# Patient Record
Sex: Female | Born: 1995 | Race: Black or African American | Hispanic: No | Marital: Single | Smoking: Never smoker
Health system: Southern US, Community
[De-identification: ages and names within clinical notes are randomized; demographics above are authoritative.]

## PROBLEM LIST (undated history)

## (undated) DIAGNOSIS — Z789 Other specified health status: Secondary | ICD-10-CM

## (undated) HISTORY — PX: NO PAST SURGERIES: SHX2092

---

## 2018-08-08 ENCOUNTER — Ambulatory Visit (HOSPITAL_COMMUNITY)
Admission: EM | Admit: 2018-08-08 | Discharge: 2018-08-08 | Discharge status: Absent without leave | Payer: PRIVATE HEALTH INSURANCE

## 2018-11-07 DIAGNOSIS — J45909 Unspecified asthma, uncomplicated: Secondary | ICD-10-CM | POA: Insufficient documentation

## 2019-09-26 ENCOUNTER — Other Ambulatory Visit: Payer: Self-pay

## 2019-09-26 DIAGNOSIS — Z20822 Contact with and (suspected) exposure to covid-19: Secondary | ICD-10-CM

## 2019-09-27 LAB — NOVEL CORONAVIRUS, NAA: SARS-CoV-2, NAA: NOT DETECTED

## 2020-10-29 DIAGNOSIS — U071 COVID-19: Secondary | ICD-10-CM | POA: Diagnosis not present

## 2020-11-01 DIAGNOSIS — Z20822 Contact with and (suspected) exposure to covid-19: Secondary | ICD-10-CM | POA: Diagnosis not present

## 2021-06-24 ENCOUNTER — Inpatient Hospital Stay (HOSPITAL_COMMUNITY)
Admission: AD | Admit: 2021-06-24 | Discharge: 2021-06-24 | Disposition: A | Discharge status: Home / Self Care | Payer: BC Managed Care – PPO | Attending: Obstetrics and Gynecology | Admitting: Obstetrics and Gynecology

## 2021-06-24 ENCOUNTER — Other Ambulatory Visit: Payer: Self-pay

## 2021-06-24 ENCOUNTER — Inpatient Hospital Stay (HOSPITAL_COMMUNITY): Payer: BC Managed Care – PPO

## 2021-06-24 ENCOUNTER — Encounter (HOSPITAL_COMMUNITY): Payer: Self-pay | Admitting: Obstetrics and Gynecology

## 2021-06-24 DIAGNOSIS — O021 Missed abortion: Secondary | ICD-10-CM | POA: Diagnosis not present

## 2021-06-24 DIAGNOSIS — O26851 Spotting complicating pregnancy, first trimester: Secondary | ICD-10-CM | POA: Diagnosis not present

## 2021-06-24 DIAGNOSIS — Z3A08 8 weeks gestation of pregnancy: Secondary | ICD-10-CM | POA: Insufficient documentation

## 2021-06-24 DIAGNOSIS — O99891 Other specified diseases and conditions complicating pregnancy: Secondary | ICD-10-CM | POA: Diagnosis not present

## 2021-06-24 DIAGNOSIS — N898 Other specified noninflammatory disorders of vagina: Secondary | ICD-10-CM | POA: Diagnosis not present

## 2021-06-24 DIAGNOSIS — Z5321 Procedure and treatment not carried out due to patient leaving prior to being seen by health care provider: Secondary | ICD-10-CM | POA: Diagnosis not present

## 2021-06-24 DIAGNOSIS — O209 Hemorrhage in early pregnancy, unspecified: Secondary | ICD-10-CM | POA: Diagnosis not present

## 2021-06-24 DIAGNOSIS — O26899 Other specified pregnancy related conditions, unspecified trimester: Secondary | ICD-10-CM

## 2021-06-24 HISTORY — DX: Other specified health status: Z78.9

## 2021-06-24 LAB — CBC WITH DIFFERENTIAL/PLATELET
Abs Immature Granulocytes: 0.03 10*3/uL (ref 0.00–0.07)
Basophils Absolute: 0 10*3/uL (ref 0.0–0.1)
Basophils Relative: 1 %
Eosinophils Absolute: 0.1 10*3/uL (ref 0.0–0.5)
Eosinophils Relative: 1 %
HCT: 34 % — ABNORMAL LOW (ref 36.0–46.0)
Hemoglobin: 11.8 g/dL — ABNORMAL LOW (ref 12.0–15.0)
Immature Granulocytes: 1 %
Lymphocytes Relative: 24 %
Lymphs Abs: 1.6 10*3/uL (ref 0.7–4.0)
MCH: 31 pg (ref 26.0–34.0)
MCHC: 34.7 g/dL (ref 30.0–36.0)
MCV: 89.2 fL (ref 80.0–100.0)
Monocytes Absolute: 0.7 10*3/uL (ref 0.1–1.0)
Monocytes Relative: 10 %
Neutro Abs: 4.2 10*3/uL (ref 1.7–7.7)
Neutrophils Relative %: 63 %
Platelets: 331 10*3/uL (ref 150–400)
RBC: 3.81 MIL/uL — ABNORMAL LOW (ref 3.87–5.11)
RDW: 11.8 % (ref 11.5–15.5)
WBC: 6.6 10*3/uL (ref 4.0–10.5)
nRBC: 0 % (ref 0.0–0.2)

## 2021-06-24 LAB — URINALYSIS, ROUTINE W REFLEX MICROSCOPIC
Bilirubin Urine: NEGATIVE
Glucose, UA: NEGATIVE mg/dL
Ketones, ur: NEGATIVE mg/dL
Leukocytes,Ua: NEGATIVE
Nitrite: NEGATIVE
Protein, ur: NEGATIVE mg/dL
Specific Gravity, Urine: 1.024 (ref 1.005–1.030)
pH: 8 (ref 5.0–8.0)

## 2021-06-24 LAB — WET PREP, GENITAL
Clue Cells Wet Prep HPF POC: NONE SEEN
Sperm: NONE SEEN
Trich, Wet Prep: NONE SEEN
Yeast Wet Prep HPF POC: NONE SEEN

## 2021-06-24 LAB — ABO/RH: ABO/RH(D): O POS

## 2021-06-24 LAB — POCT PREGNANCY, URINE: Preg Test, Ur: POSITIVE — AB

## 2021-06-24 LAB — HCG, QUANTITATIVE, PREGNANCY: hCG, Beta Chain, Quant, S: 188893 m[IU]/mL — ABNORMAL HIGH (ref <5)

## 2021-06-24 LAB — HIV ANTIBODY (ROUTINE TESTING W REFLEX): HIV Screen 4th Generation wRfx: NONREACTIVE

## 2021-06-24 MED ORDER — OXYCODONE-ACETAMINOPHEN 5-325 MG PO TABS: 1.0000 | ORAL_TABLET | ORAL | 0 refills | Status: DC | PRN

## 2021-06-24 MED ORDER — PROMETHAZINE HCL 12.5 MG PO TABS: 12.5000 mg | ORAL_TABLET | Freq: Four times a day (QID) | ORAL | 0 refills | Status: DC | PRN

## 2021-06-24 MED ORDER — IBUPROFEN 600 MG PO TABS: 600.0000 mg | ORAL_TABLET | Freq: Four times a day (QID) | ORAL | 0 refills | Status: DC | PRN

## 2021-06-24 NOTE — MAU Note (Signed)
Pt reports she had a positive test in July.  [facility] . Started having some old brownish blood when she wiped for the past few days. Denies any cramping or pain.

## 2021-06-24 NOTE — MAU Provider Note (Signed)
History     CSN: [id number]  Arrival date and time: 06/24/21 1220   Event Date/Time   First Provider Initiated Contact with Patient 06/24/21 1255      No chief complaint on file.  HPI Ms. Genola Mourer is a 25 y.o. G2P0010 at [redacted]w[redacted]d who presents to MAU today with complaint of vaginal bleeding. The patient states brown spotting started recently. She denies abdominal pain, cramping, UTI symptoms, other abnormal discharge, bright red bleeding, N/V/D or constipation. She [facility] [place] [facility] showing "a sac". She denies recent intercourse or exam.   OB History     Gravida  2   Para      Term      Preterm      AB  1   Living         SAB  1   IAB      Ectopic      Multiple      Live Births              Past Medical History:  Diagnosis Date   Medical history non-contributory     Past Surgical History:  Procedure Laterality Date   NO PAST SURGERIES      No family history on file.  Social History   Tobacco Use   Smoking status: Never   Smokeless tobacco: Never  Vaping Use   Vaping Use: Former   Start date: 05/25/2021  Substance Use Topics   Alcohol use: Not Currently   Drug use: Not Currently    Allergies:  Allergies  Allergen Reactions   Shellfish Allergy Nausea And Vomiting    No medications prior to admission.    Review of Systems  Constitutional:  Negative for fever.  Gastrointestinal:  Negative for abdominal pain, constipation, diarrhea, nausea and vomiting.  Genitourinary:  Positive for vaginal bleeding and vaginal discharge. Negative for dysuria, frequency and urgency.  Physical Exam   Blood pressure 129/70, pulse 94, temperature 98.6 F (37 C), resp. rate 18, height 5\' 5"  (1.651 m), weight 68.5 kg, last menstrual period 04/23/2021.  Physical Exam Vitals and nursing note reviewed. Exam conducted with a chaperone present.  Constitutional:      General: She is not in acute distress.    Appearance: She is  well-developed.  HENT:     Head: Normocephalic and atraumatic.  Cardiovascular:     Rate and Rhythm: Normal rate.  Pulmonary:     Effort: Pulmonary effort is normal.  Abdominal:     General: There is no distension.     Palpations: Abdomen is soft. There is no mass.     Tenderness: There is no abdominal tenderness. There is no guarding or rebound.  Genitourinary:    General: Normal vulva.     Vagina: Vaginal discharge (scant, brown-blood-tinged discharge) and bleeding (scant brown) present.     Cervix: No cervical motion tenderness, discharge or friability.     Uterus: Enlarged. Not tender.      Adnexa:        Right: No mass or tenderness.         Left: Tenderness (mild) present. No mass.    Skin:    General: Skin is warm and dry.     Findings: No erythema.  Neurological:     Mental Status: She is alert and oriented to person, place, and time.  Dilation: Closed Effacement (%): Thick Cervical Position: Posterior Exam by:: [email or web address], PA-C  Results for orders placed or  performed during the hospital encounter of 06/24/21 (from the past 24 hour(s))  Pregnancy, urine POC     Status: Abnormal   Collection Time: 06/24/21 12:36 PM  Result Value Ref Range   Preg Test, Ur POSITIVE (A) NEGATIVE  Urinalysis, Routine w reflex microscopic Urine, Clean Catch     Status: Abnormal   Collection Time: 06/24/21 12:40 PM  Result Value Ref Range   Color, Urine YELLOW YELLOW   APPearance HAZY (A) CLEAR   Specific Gravity, Urine 1.024 1.005 - 1.030   pH 8.0 5.0 - 8.0   Glucose, UA NEGATIVE NEGATIVE mg/dL   Hgb urine dipstick MODERATE (A) NEGATIVE   Bilirubin Urine NEGATIVE NEGATIVE   Ketones, ur NEGATIVE NEGATIVE mg/dL   Protein, ur NEGATIVE NEGATIVE mg/dL   Nitrite NEGATIVE NEGATIVE   Leukocytes,Ua NEGATIVE NEGATIVE   RBC / HPF 11-20 0 - 5 RBC/hpf   WBC, UA 0-5 0 - 5 WBC/hpf   Bacteria, UA RARE (A) NONE SEEN   Squamous Epithelial / LPF 6-10 0 - 5   Mucus PRESENT   CBC with  Differential/Platelet     Status: Abnormal   Collection Time: 06/24/21 12:54 PM  Result Value Ref Range   WBC 6.6 4.0 - 10.5 K/uL   RBC 3.81 (L) 3.87 - 5.11 MIL/uL   Hemoglobin 11.8 (L) 12.0 - 15.0 g/dL   HCT 47.4 (L) 25.9 - 56.3 %   MCV 89.2 80.0 - 100.0 fL   MCH 31.0 26.0 - 34.0 pg   MCHC 34.7 30.0 - 36.0 g/dL   RDW 87.5 64.3 - 32.9 %   Platelets 331 150 - 400 K/uL   nRBC 0.0 0.0 - 0.2 %   Neutrophils Relative % 63 %   Neutro Abs 4.2 1.7 - 7.7 K/uL   Lymphocytes Relative 24 %   Lymphs Abs 1.6 0.7 - 4.0 K/uL   Monocytes Relative 10 %   Monocytes Absolute 0.7 0.1 - 1.0 K/uL   Eosinophils Relative 1 %   Eosinophils Absolute 0.1 0.0 - 0.5 K/uL   Basophils Relative 1 %   Basophils Absolute 0.0 0.0 - 0.1 K/uL   Immature Granulocytes 1 %   Abs Immature Granulocytes 0.03 0.00 - 0.07 K/uL  ABO/Rh     Status: None   Collection Time: 06/24/21 12:54 PM  Result Value Ref Range   ABO/RH(D) O POS    No rh immune globuloin      NOT A RH IMMUNE GLOBULIN CANDIDATE, PT RH POSITIVE Performed at [facility], [address], [place], [place] [address]   Wet prep, genital     Status: Abnormal   Collection Time: 06/24/21 12:58 PM   Specimen: PATH Cytology Cervicovaginal Ancillary Only  Result Value Ref Range   Yeast Wet Prep HPF POC NONE SEEN NONE SEEN   Trich, Wet Prep NONE SEEN NONE SEEN   Clue Cells Wet Prep HPF POC NONE SEEN NONE SEEN   WBC, Wet Prep HPF POC FEW (A) NONE SEEN   Sperm NONE SEEN    US OB Comp Less 14 Wks  Result Date: 06/24/2021 CLINICAL DATA:  Manson Passey spotting EXAM: OBSTETRIC <14 WK [place] AND TRANSVAGINAL OB US TECHNIQUE: Both transabdominal and transvaginal ultrasound examinations were performed for complete evaluation of the gestation as well as the maternal uterus, adnexal regions, and pelvic cul-de-sac. Transvaginal technique was performed to assess early pregnancy. COMPARISON:  None. FINDINGS: Intrauterine gestational sac: Single Yolk sac:  Visualized. Embryo:   Visualized. Cardiac Activity: Not  Visualized. Heart Rate: No detectable cardiac activity. CRL:  21.0 mm   8 w   4 d                  [place] EDC: 01/30/2022 Subchorionic hemorrhage:  None visualized. Maternal uterus/adnexae: Normal bilateral ovaries. IMPRESSION: Single intrauterine gestational sac. No detectable cardiac activity with embryo crown-rump length of 21.0 mm. These findings meet definitive criteria for failed pregnancy. This follows SRU consensus guidelines: Diagnostic Criteria for Nonviable Pregnancy Early in the First Trimester. [facility] [phone]. Electronically Signed   By: Caprice Renshaw   On: 06/24/2021 13:55     MAU Course  Procedures None  MDM +UPT UA, wet prep, GC/chlamydia, CBC, ABO/Rh, quant hCG, HIV, RPR and [place] today to rule out ectopic pregnancy Discussed options for management of MAB with patient including expectant management, Cytotec and surgical. Patient opts for expectant management at this time.  Assessment and Plan  A: Missed AB at [redacted]w[redacted]d  P: Discharge home Rx for Phenergan, Ibuprofen and Percocet sent to patient's pharmacy  Bleeding and infection precautions discussed Patient advised to follow-up with [facility] in 2 weeks, they will call with appointment time Patient may return to MAU as needed or if her condition were to change or worsen  Vonzella Nipple, PA-C 06/24/2021, 2:15 PM

## 2021-06-25 LAB — GC/CHLAMYDIA PROBE AMP (~~LOC~~) NOT AT ARMC
Chlamydia: NEGATIVE
Comment: NEGATIVE
Comment: NORMAL
Neisseria Gonorrhea: NEGATIVE

## 2021-06-25 LAB — RPR: RPR Ser Ql: NONREACTIVE

## 2021-06-30 DIAGNOSIS — O200 Threatened abortion: Secondary | ICD-10-CM | POA: Diagnosis not present

## 2021-07-06 DIAGNOSIS — O3680X0 Pregnancy with inconclusive fetal viability, not applicable or unspecified: Secondary | ICD-10-CM | POA: Diagnosis not present

## 2021-07-06 DIAGNOSIS — Z3A01 Less than 8 weeks gestation of pregnancy: Secondary | ICD-10-CM | POA: Diagnosis not present

## 2021-07-14 DIAGNOSIS — Z3481 Encounter for supervision of other normal pregnancy, first trimester: Secondary | ICD-10-CM | POA: Diagnosis not present

## 2021-07-14 LAB — OB RESULTS CONSOLE HGB/HCT, BLOOD
HCT: 34 (ref 29–41)
Hemoglobin: 11.5

## 2021-07-14 LAB — OB RESULTS CONSOLE PLATELET COUNT: Platelets: 345

## 2021-07-14 LAB — OB RESULTS CONSOLE RPR: RPR: NONREACTIVE

## 2021-07-14 LAB — OB RESULTS CONSOLE HEPATITIS B SURFACE ANTIGEN: Hepatitis B Surface Ag: NEGATIVE

## 2021-07-14 LAB — OB RESULTS CONSOLE ANTIBODY SCREEN: Antibody Screen: NEGATIVE

## 2021-07-14 LAB — SICKLE CELL SCREEN: Sickle Cell Screen: NORMAL

## 2021-07-14 LAB — OB RESULTS CONSOLE HIV ANTIBODY (ROUTINE TESTING): HIV: NONREACTIVE

## 2021-07-14 LAB — OB RESULTS CONSOLE ABO/RH: RH Type: POSITIVE

## 2021-07-14 LAB — OB RESULTS CONSOLE GC/CHLAMYDIA
Chlamydia: NEGATIVE
Gonorrhea: NEGATIVE

## 2021-07-14 LAB — OB RESULTS CONSOLE RUBELLA ANTIBODY, IGM: Rubella: IMMUNE

## 2021-08-07 NOTE — MAU Provider Note (Signed)
Addendum: Note entered at a later date  Patient initiated contact regarding the contents of my previous MAU note stating that the transvaginal US was not performed. The mention of transvaginal US was in the results that automatically populates into the documentation and cannot be removed, however for accuracy, per patient, she had an OB less than 14 weeks abdominal US only as the transvaginal component was unlikely necessary at this gestation to confirm the diagnosis. Abdominal US is generally performed first and the necessity of the TV component is at the discretion of the sonographer.   Vonzella Nipple, PA-C 08/07/2021 11:48 AM

## 2021-08-19 ENCOUNTER — Telehealth (INDEPENDENT_AMBULATORY_CARE_PROVIDER_SITE_OTHER): Payer: BC Managed Care – PPO

## 2021-08-19 DIAGNOSIS — Z3A16 16 weeks gestation of pregnancy: Secondary | ICD-10-CM

## 2021-08-19 DIAGNOSIS — O99512 Diseases of the respiratory system complicating pregnancy, second trimester: Secondary | ICD-10-CM

## 2021-08-19 DIAGNOSIS — J45909 Unspecified asthma, uncomplicated: Secondary | ICD-10-CM

## 2021-08-19 DIAGNOSIS — Z3492 Encounter for supervision of normal pregnancy, unspecified, second trimester: Secondary | ICD-10-CM | POA: Insufficient documentation

## 2021-08-19 NOTE — Progress Notes (Signed)
New OB Intake  I connected with  Misty Lee on 08/19/21 at  9:15 AM EDT by MyChart Video Visit and verified that I am speaking with the correct person using two identifiers. Nurse is located at [facility] and pt is located at home.  I discussed the limitations, risks, security and privacy concerns of performing an evaluation and management service by telephone and the availability of in person appointments. I also discussed with the patient that there may be a patient responsible charge related to this service. The patient expressed understanding and agreed to proceed.  I explained I am completing New OB Intake today. We discussed her EDD of 02/01/22 that is based on LMP of 04/27/21. Pt is G2/P0. I reviewed her allergies, medications, Medical/Surgical/OB history, and appropriate screenings. I informed her of [facility] services. [facility] information placed in AVS. Based on history, this is a low risk pregnancy.  Patient Active Problem List   Diagnosis Date Noted   Supervision of low-risk pregnancy, second trimester 08/19/2021   Asthma 11/07/2018   Concerns addressed today Patient reports that she was diagnosed with miscarriage at MAU on 06/24/21. States that she followed up at [facility] where viability of pregnancy was confirmed; PAP smear and ob blood work completed.   Needs refill of inhaler. Does not currently have PRN inhaler for asthma.  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  Delivery Plans:  Plans to deliver at [facility].   MyChart/Babyscripts MyChart access verified. I explained pt will have some visits in office and some virtually. Babyscripts instructions given and order placed.   Blood Pressure Cuff  Medicaid pending. Offered for patient to purchase BP cuff or to wait until Medicaid has been approved and receive through a pharmacy. Explained after first prenatal appt pt will check weekly and document in Babyscripts.  Weight scale:  Patient has weight scale.  Anatomy US Explained first scheduled [place] will be around 19 weeks. Anatomy US scheduled for 09/17/21 at 0830. Pt notified to arrive at 0830.  Labs Discussed Avelina Laine genetic screening with patient. Would like both Panorama and Horizon drawn at new OB visit. Pt has had OB labs. Needs hep C drawn.   Covid Vaccine Patient has not covid vaccine.   Mother/ Baby Dyad Candidate?    Yes; patient would like to enroll in program. Judeth Cornfield, RN notified to schedule patient.  Social Determinants of Health Food Insecurity: Patient denies food insecurity. WIC Referral: Patient is interested in referral to [facility].  Transportation: Patient denies transportation needs. Childcare: Discussed no children allowed at ultrasound appointments. Offered childcare services; patient declines childcare services at this time.   First visit review I reviewed new OB appt with pt. I explained she will have a provider visit that includes a physical exam and blood work. Explained pt will be seen by Merian Capron, MD at first visit; encounter routed to appropriate provider. Explained that patient will be seen by pregnancy navigator following visit with provider.   Marjo Bicker, RN 08/19/2021  9:41 AM

## 2021-09-07 ENCOUNTER — Encounter: Payer: BC Managed Care – PPO | Admitting: Obstetrics and Gynecology

## 2021-09-17 ENCOUNTER — Ambulatory Visit: Payer: BC Managed Care – PPO | Admitting: *Deleted

## 2021-09-17 ENCOUNTER — Other Ambulatory Visit: Payer: Self-pay | Admitting: Family Medicine

## 2021-09-17 ENCOUNTER — Ambulatory Visit: Payer: BC Managed Care – PPO

## 2021-09-17 ENCOUNTER — Other Ambulatory Visit: Payer: Self-pay

## 2021-09-17 ENCOUNTER — Ambulatory Visit: Payer: BC Managed Care – PPO | Attending: Family Medicine

## 2021-09-17 ENCOUNTER — Other Ambulatory Visit: Payer: Self-pay | Admitting: *Deleted

## 2021-09-17 VITALS — BP 133/63 | HR 65

## 2021-09-17 DIAGNOSIS — O35DXX0 Maternal care for other (suspected) fetal abnormality and damage, fetal gastrointestinal anomalies, not applicable or unspecified: Secondary | ICD-10-CM

## 2021-09-17 DIAGNOSIS — Z3492 Encounter for supervision of normal pregnancy, unspecified, second trimester: Secondary | ICD-10-CM | POA: Diagnosis not present

## 2021-09-17 DIAGNOSIS — O283 Abnormal ultrasonic finding on antenatal screening of mother: Secondary | ICD-10-CM

## 2021-09-21 ENCOUNTER — Other Ambulatory Visit: Payer: Self-pay

## 2021-09-21 ENCOUNTER — Encounter: Payer: Self-pay | Admitting: Family Medicine

## 2021-09-21 ENCOUNTER — Ambulatory Visit (INDEPENDENT_AMBULATORY_CARE_PROVIDER_SITE_OTHER): Payer: BC Managed Care – PPO | Admitting: Family Medicine

## 2021-09-21 ENCOUNTER — Encounter: Payer: Self-pay | Admitting: *Deleted

## 2021-09-21 VITALS — Wt 155.8 lb

## 2021-09-21 DIAGNOSIS — J45909 Unspecified asthma, uncomplicated: Secondary | ICD-10-CM

## 2021-09-21 DIAGNOSIS — Z3492 Encounter for supervision of normal pregnancy, unspecified, second trimester: Secondary | ICD-10-CM

## 2021-09-21 DIAGNOSIS — J4520 Mild intermittent asthma, uncomplicated: Secondary | ICD-10-CM

## 2021-09-21 DIAGNOSIS — O99519 Diseases of the respiratory system complicating pregnancy, unspecified trimester: Secondary | ICD-10-CM

## 2021-09-21 MED ORDER — ALBUTEROL SULFATE HFA 108 (90 BASE) MCG/ACT IN AERS: 1.0000 | INHALATION_SPRAY | Freq: Four times a day (QID) | RESPIRATORY_TRACT | 6 refills | Status: AC | PRN

## 2021-09-21 NOTE — Progress Notes (Signed)
Pt needs to discuss work breaks Needs refill on inhaler Received care in [place], [place] prior to [facility] visit today.

## 2021-09-21 NOTE — Progress Notes (Signed)
   Initial PRENATAL VISIT NOTE  Subjective:  Misty Lee is a 25 y.o. G2P0010 at [redacted]w[redacted]d being seen today for in prenatal care.  She is currently monitored for the following issues for this low-risk pregnancy and has Asthma and Supervision of low-risk pregnancy, second trimester on their problem list.  Patient reports no complaints.  Contractions: Not present. Vag. Bleeding: None.  Movement: Present. Denies leaking of fluid.   The following portions of the patient's history were reviewed and updated as appropriate: allergies, current medications, past family history, past medical history, past social history, past surgical history and problem list.   Objective:   Vitals:   09/21/21 0919  Weight: 155 lb 12.8 oz (70.7 kg)    Fetal Status: Fetal Heart Rate (bpm): 145   Movement: Present     General:  Alert, oriented and cooperative. Patient is in no acute distress.  Skin: Skin is warm and dry. No rash noted.   Cardiovascular: Normal heart rate noted  Respiratory: Normal respiratory effort, no problems with respiration noted  Abdomen: Soft, gravid, appropriate for gestational age.  Pain/Pressure: Absent     Pelvic: Cervical exam deferred        Extremities: Normal range of motion.  Edema: None  Mental Status: Normal mood and affect. Normal behavior. Normal judgment and thought content.   Assessment and Plan:  Pregnancy: G2P0010 at [redacted]w[redacted]d 1. Supervision of low-risk pregnancy, second trimester Reviewed practice model Reviewed cadence of care and shared care model Patient desires genetic screening-- Maternity21 drawn by MFM on 10/27. Anatomy scan showed intercardiac foci Filled out accomodation paperwork today Desires food market visit Declined varicella and HCV draw today (need to prepare for bloodwork-- OK to get next visit)  2. Mild intermittent asthma without complication - albuterol (VENTOLIN HFA) 108 (90 Base) MCG/ACT inhaler; Inhale 1-2 puffs into the lungs every 6 (six) hours as  needed for wheezing or shortness of breath.  Dispense: 1 each; Refill: 6  3. Asthma during pregnancy See above  Preterm labor symptoms and general obstetric precautions including but not limited to vaginal bleeding, contractions, leaking of fluid and fetal movement were reviewed in detail with the patient. Please refer to After Visit Summary for other counseling recommendations.   Return in about 4 weeks (around 10/19/2021) for Routine prenatal care, Dual Care-MB Dyad.  Future Appointments  Date Time Provider Department Center  10/19/2021  9:00 AM [facility] NURSE [facility]  10/19/2021  9:15 AM [facility] US2 [facility]  10/23/2021  8:35 AM [facility]    Federico Flake, MD

## 2021-09-22 ENCOUNTER — Telehealth: Payer: Self-pay | Admitting: Genetics

## 2021-09-22 LAB — MATERNIT21 PLUS CORE+SCA
Fetal Fraction: 9
Monosomy X (Turner Syndrome): NOT DETECTED
Result (T21): NEGATIVE
Trisomy 13 (Patau syndrome): NEGATIVE
Trisomy 18 (Edwards syndrome): NEGATIVE
Trisomy 21 (Down syndrome): NEGATIVE
XXX (Triple X Syndrome): NOT DETECTED
XXY (Klinefelter Syndrome): NOT DETECTED
XYY (Jacobs Syndrome): NOT DETECTED

## 2021-09-22 NOTE — Telephone Encounter (Signed)
Misty Lee was contacted by telephone to review their noninvasive prenatal screening (NIPS) result. The result is low risk. This screening significantly reduces the risk that the current pregnancy has Down syndrome, Trisomy 7, Trisomy 69, and common sex chromosome aneuploidies. Misty Lee understands that this is a screening and not a diagnostic test. All questions answered.

## 2021-09-24 LAB — CULTURE, OB URINE

## 2021-09-24 LAB — URINE CULTURE, OB REFLEX

## 2021-10-02 ENCOUNTER — Inpatient Hospital Stay (HOSPITAL_COMMUNITY)
Admission: AD | Admit: 2021-10-02 | Discharge: 2021-10-02 | Disposition: A | Discharge status: Home / Self Care | Payer: BC Managed Care – PPO | Attending: Obstetrics & Gynecology | Admitting: Obstetrics & Gynecology

## 2021-10-02 DIAGNOSIS — R102 Pelvic and perineal pain: Secondary | ICD-10-CM | POA: Diagnosis not present

## 2021-10-02 DIAGNOSIS — O26892 Other specified pregnancy related conditions, second trimester: Secondary | ICD-10-CM | POA: Insufficient documentation

## 2021-10-02 DIAGNOSIS — R109 Unspecified abdominal pain: Secondary | ICD-10-CM | POA: Diagnosis not present

## 2021-10-02 DIAGNOSIS — Z3A21 21 weeks gestation of pregnancy: Secondary | ICD-10-CM | POA: Diagnosis not present

## 2021-10-02 DIAGNOSIS — O26899 Other specified pregnancy related conditions, unspecified trimester: Secondary | ICD-10-CM

## 2021-10-02 DIAGNOSIS — O368120 Decreased fetal movements, second trimester, not applicable or unspecified: Secondary | ICD-10-CM | POA: Diagnosis not present

## 2021-10-02 LAB — URINALYSIS, ROUTINE W REFLEX MICROSCOPIC
Bilirubin Urine: NEGATIVE
Glucose, UA: NEGATIVE mg/dL
Hgb urine dipstick: NEGATIVE
Ketones, ur: NEGATIVE mg/dL
Leukocytes,Ua: NEGATIVE
Nitrite: NEGATIVE
Protein, ur: NEGATIVE mg/dL
Specific Gravity, Urine: 1.01 (ref 1.005–1.030)
pH: 6 (ref 5.0–8.0)

## 2021-10-02 MED ORDER — CYCLOBENZAPRINE HCL 10 MG PO TABS: 10.0000 mg | ORAL_TABLET | Freq: Three times a day (TID) | ORAL | 0 refills | Status: AC | PRN

## 2021-10-02 MED ORDER — CYCLOBENZAPRINE HCL 5 MG PO TABS: 10.0000 mg | ORAL_TABLET | Freq: Three times a day (TID) | ORAL | Status: DC | PRN

## 2021-10-02 NOTE — MAU Provider Note (Signed)
Obstetric Attending MAU Note  Chief Complaint:  Abdominal Pain and Decreased Fetal Movement   Event Date/Time   First Provider Initiated Contact with Patient 10/02/21 2137     HPI: Misty Lee is a 25 y.o. G2P0010 at [redacted]w[redacted]d who presents to maternity admissions reporting decreased fetal movement and mild right sided cramping. Denies contractions, leakage of fluid or vaginal bleeding.   Pregnancy Course: Receives care at [facility] Patient Active Problem List   Diagnosis Date Noted   Supervision of low-risk pregnancy, second trimester 08/19/2021   Asthma 11/07/2018    Past Medical History:  Diagnosis Date   Medical history non-contributory     OB History  Gravida Para Term Preterm AB Living  2 0 0 0 1 0  SAB IAB Ectopic Multiple Live Births  1 0 0 0 0    # Outcome Date GA Lbr Len/2nd Weight Sex Delivery Anes PTL Lv  2 Current           1 SAB             Past Surgical History:  Procedure Laterality Date   NO PAST SURGERIES      Family History: Family History  Problem Relation Age of Onset   Breast cancer Maternal Grandmother    Hypertension Maternal Grandmother    Lung cancer Paternal Grandmother     Social History: Social History   Tobacco Use   Smoking status: Never   Smokeless tobacco: Never  Vaping Use   Vaping Use: Former   Start date: 05/25/2021   Substances: CBD  Substance Use Topics   Alcohol use: Not Currently    Alcohol/week: 3.0 standard drinks    Types: 3 Shots of liquor per week    Comment: 3 drinks each weekend   Drug use: Never    Allergies:  Allergies  Allergen Reactions   Shellfish Allergy Nausea And Vomiting    Medications Prior to Admission  Medication Sig Dispense Refill Last Dose   albuterol (VENTOLIN HFA) 108 (90 Base) MCG/ACT inhaler Inhale 1-2 puffs into the lungs every 6 (six) hours as needed for wheezing or shortness of breath. 1 each 6    Prenatal Vit-Fe Fumarate-FA (MULTIVITAMIN-PRENATAL) 27-0.8 MG TABS tablet Take 1 tablet by  mouth daily at 12 noon.       ROS: Pertinent findings in history of present illness.  Physical Exam  Blood pressure 122/64, pulse 76, temperature 98.1 F (36.7 C), temperature source Oral, resp. rate 17, height 5\' 5"  (1.651 m), weight 73.3 kg, last menstrual period 04/27/2021, SpO2 100 %. CONSTITUTIONAL: Well-developed, well-nourished female in no acute distress.  HENT:  Normocephalic, atraumatic, External right and left ear normal. Oropharynx is clear and moist EYES: Conjunctivae and EOM are normal. Pupils are equal, round, and reactive to light. No scleral icterus.  NECK: Normal range of motion, supple, no masses SKIN: Skin is warm and dry. No rash noted. Not diaphoretic. No erythema. No pallor. NEUROLGIC: Alert and oriented to person, place, and time. Normal reflexes, muscle tone coordination. No cranial nerve deficit noted. PSYCHIATRIC: Normal mood and affect. Normal behavior. Normal judgment and thought content. CARDIOVASCULAR: Normal heart rate noted, regular rhythm RESPIRATORY: Effort and breath sounds normal, no problems with respiration noted ABDOMEN: Soft, mild RLQ tenderness, no rebound or guarding, nondistended, gravid appropriate for gestational age MUSCULOSKELETAL: Normal range of motion. No edema and no tenderness. 2+ distal pulses.   FHR:  150 bpm   Labs: Results for orders placed or performed during the hospital encounter  of 10/02/21 (from the past 24 hour(s))  Urinalysis, Routine w reflex microscopic Urine, Clean Catch     Status: Abnormal   Collection Time: 10/02/21  9:07 PM  Result Value Ref Range   Color, Urine YELLOW YELLOW   APPearance HAZY (A) CLEAR   Specific Gravity, Urine 1.010 1.005 - 1.030   pH 6.0 5.0 - 8.0   Glucose, UA NEGATIVE NEGATIVE mg/dL   Hgb urine dipstick NEGATIVE NEGATIVE   Bilirubin Urine NEGATIVE NEGATIVE   Ketones, ur NEGATIVE NEGATIVE mg/dL   Protein, ur NEGATIVE NEGATIVE mg/dL   Nitrite NEGATIVE NEGATIVE   Leukocytes,Ua NEGATIVE  NEGATIVE    Imaging:  [place] MFM OB DETAIL +14 WK  Result Date: 09/17/2021 ----------------------------------------------------------------------  OBSTETRICS REPORT                       (Signed Final 09/17/2021 10:10 am) ---------------------------------------------------------------------- Patient Info  ID #:       [id number]                          D.O.B.:  10-27-1996 (25 yrs)  Name:       Misty Lee                    Visit Date: 09/17/2021 08:27 am ---------------------------------------------------------------------- Performed By  Attending:        Ma Rings MD         Ref. Address:     [address]                                                             [place], [place]                                                             [address]  Performed By:     Clayton Lefort RDMS       [facility]  Referred By:      Venora Maples MD ---------------------------------------------------------------------- Orders  #  Description                           Code        Ordered By  1  [place] MFM OB DETAIL +14 WK               76811.01    MATTHEW ECKSTAT ----------------------------------------------------------------------  #  Order #                     Accession #                Episode #  1  [id number]                   [id number]                 [id number] ---------------------------------------------------------------------- Indications  Echogenic intracardiac focus of the heart      O35.8XX0  (EIF)  [redacted] weeks gestation of pregnancy                Z3A.19  Encounter for antenatal screening for          Z36.3  malformations  Asthma                                         O99.89 j45.909  ---------------------------------------------------------------------- Fetal Evaluation  Num Of Fetuses:         1  Fetal Heart Rate(bpm):  145  Cardiac Activity:       Observed  Presentation:           Breech  Placenta:               Posterior  P. Cord Insertion:      Visualized, central  Amniotic Fluid  AFI FV:      Within normal limits                              Largest Pocket(cm)                              4.09 ---------------------------------------------------------------------- Biometry  BPD:      40.6  mm     G. Age:  18w 2d         22  %    CI:        69.17   %    70 - 86  FL/HC:      18.1   %    16.1 - 18.3  HC:      155.9  mm     G. Age:  18w 4d         21  %    HC/AC:      1.09        1.09 - 1.39  AC:      142.4  mm     G. Age:  19w 4d         66  %    FL/BPD:     69.5   %  FL:       28.2  mm     G. Age:  18w 4d         30  %    FL/AC:      19.8   %    20 - 24  HUM:        28  mm     G. Age:  19w 0d         50  %  CER:      18.4  mm     G. Age:  18w 1d          8  %  NFT:       3.6  mm  LV:        5.3  mm  CM:        2.7  mm  Est. FW:     272  gm    0 lb 10 oz      49  % ---------------------------------------------------------------------- OB History  Gravidity:    2          SAB:   1  Living:       0 ---------------------------------------------------------------------- Gestational Age  LMP:           20w 3d        Date:  04/27/21                 EDD:   02/01/22  U/S Today:     18w 5d                                        EDD:   02/13/22  Best:          19w 0d     Det. By:  Marcella Dubs         EDD:   02/11/22                                      (07/06/21) ---------------------------------------------------------------------- Anatomy  Cranium:               Appears normal         Aortic Arch:            Appears normal  Cavum:                 Appears normal         Ductal Arch:            Appears normal  Ventricles:            Appears  normal         Diaphragm:  Appears normal  Choroid Plexus:        Appears normal         Stomach:                Appears normal, left                                                                        sided  Cerebellum:            Appears normal         Abdomen:                Appears normal  Posterior Fossa:       Appears normal         Abdominal Wall:         Appears nml (cord                                                                        insert, abd wall)  Nuchal Fold:           Appears normal         Cord Vessels:           Appears normal (3                                                                        vessel cord)  Face:                  Appears normal         Kidneys:                Appear normal                         (orbits and profile)  Lips:                  Appears normal         Bladder:                Appears normal  Thoracic:              Appears normal         Spine:                  Appears normal  Heart:                 Appears normal EIF     Upper Extremities:      Appears normal  RVOT:                  Appears normal  Lower Extremities:      Appears normal  LVOT:                  Appears normal ---------------------------------------------------------------------- Targeted Anatomy  Thorax  SVC:                   Appears normal         3 V Trachea View:       Appears normal  3 Vessel View:         Appears normal         IVC:                    Appears normal  Other  Genitalia:             Normal Female ---------------------------------------------------------------------- Cervix Uterus Adnexa  Cervix  Length:           3.76  cm.  Normal appearance by transabdominal scan.  Right Ovary  Visualized.  Left Ovary  Visualized. ---------------------------------------------------------------------- Comments  This patient was seen for a detailed fetal anatomy scan.  The  patient has not started prenatal care.  She denies any significant past medical history and denies   any problems in her current pregnancy.  As she has not started prenatal care, she has not had a  screening test for fetal aneuploidy drawn in her current  pregnancy.  She was informed that the fetal growth and amniotic fluid  level were appropriate for her gestational age.  On today's exam, an intracardiac echogenic focus was noted  in the left ventricle of the fetal heart.  The small association  between an echogenic focus and Down syndrome was  discussed. Due to the echogenic focus noted today, the  patient was offered and declined an amniocentesis today for  definitive diagnosis of fetal aneuploidy.  The patient was sent to the lab following today's ultrasound  exam to have a cell free DNA test (MaterniT21) drawn.  The patient was informed that anomalies may be missed due  to technical limitations. If the fetus is in a suboptimal position  or maternal habitus is increased, visualization of the fetus in  the maternal uterus may be impaired.  A follow-up exam was scheduled in 4 weeks to confirm her  dates. ----------------------------------------------------------------------                   Ma Rings, MD Electronically Signed Final Report   09/17/2021 10:10 am ----------------------------------------------------------------------   MAU Course:   Assessment: 1. Pain of round ligament affecting pregnancy, antepartum   2. [redacted] weeks gestation of pregnancy     Plan: Patient reassured by FHR, was assured that it was not unusual not to feel too much fetal movement at this gestational age. She has likely round ligament pain, she was reassured.  Recommended Tylenol, also prescribed Flexeril as needed.  Discharged to home in stable condition, given precautions about reasons to return to MAU.  Follow up with OB provider as scheduled.    Allergies as of 10/02/2021       Reactions   Shellfish Allergy Nausea And Vomiting        Medication List     TAKE these medications    albuterol 108 (90  Base) MCG/ACT inhaler Commonly known as: VENTOLIN HFA Inhale 1-2 puffs into the lungs every 6 (six) hours as needed for wheezing or shortness of breath.   cyclobenzaprine 10 MG tablet Commonly known as: FLEXERIL Take 1 tablet (  10 mg total) by mouth 3 (three) times daily as needed for muscle spasms.   multivitamin-prenatal 27-0.8 MG Tabs tablet Take 1 tablet by mouth daily at 12 noon.        Tereso Newcomer, MD 10/02/2021 9:53 PM

## 2021-10-02 NOTE — MAU Note (Signed)
Dr. Macon Large evaluated pt and discharged her home.

## 2021-10-02 NOTE — MAU Note (Signed)
..  Misty Lee is a 25 y.o. at [redacted]w[redacted]d here in MAU reporting: cramping since last night. No fetal movement today or yesterday. Denies vaginal bleeding or leaking of fluid.   Pain score: 5/10 Vitals:   10/02/21 2056  BP: 122/64  Pulse: 76  Resp: 17  Temp: 98.1 F (36.7 C)  SpO2: 100%     FHT:150 Lab orders placed from triage: UA

## 2021-10-06 ENCOUNTER — Encounter: Payer: Self-pay | Admitting: Lactation Services

## 2021-10-19 ENCOUNTER — Other Ambulatory Visit: Payer: Self-pay

## 2021-10-19 ENCOUNTER — Ambulatory Visit: Payer: BC Managed Care – PPO | Admitting: *Deleted

## 2021-10-19 ENCOUNTER — Ambulatory Visit: Payer: BC Managed Care – PPO | Attending: Obstetrics and Gynecology

## 2021-10-19 VITALS — BP 127/67 | HR 85

## 2021-10-19 DIAGNOSIS — Z362 Encounter for other antenatal screening follow-up: Secondary | ICD-10-CM | POA: Diagnosis not present

## 2021-10-19 DIAGNOSIS — Z3A23 23 weeks gestation of pregnancy: Secondary | ICD-10-CM | POA: Insufficient documentation

## 2021-10-19 DIAGNOSIS — Z3492 Encounter for supervision of normal pregnancy, unspecified, second trimester: Secondary | ICD-10-CM

## 2021-10-19 DIAGNOSIS — J45909 Unspecified asthma, uncomplicated: Secondary | ICD-10-CM | POA: Diagnosis not present

## 2021-10-19 DIAGNOSIS — O358XX0 Maternal care for other (suspected) fetal abnormality and damage, not applicable or unspecified: Secondary | ICD-10-CM | POA: Diagnosis not present

## 2021-10-19 DIAGNOSIS — O99891 Other specified diseases and conditions complicating pregnancy: Secondary | ICD-10-CM | POA: Insufficient documentation

## 2021-10-19 DIAGNOSIS — O283 Abnormal ultrasonic finding on antenatal screening of mother: Secondary | ICD-10-CM | POA: Insufficient documentation

## 2021-10-29 ENCOUNTER — Other Ambulatory Visit: Payer: Self-pay

## 2021-10-29 ENCOUNTER — Ambulatory Visit (INDEPENDENT_AMBULATORY_CARE_PROVIDER_SITE_OTHER): Payer: BC Managed Care – PPO | Admitting: Family Medicine

## 2021-10-29 ENCOUNTER — Encounter: Payer: Self-pay | Admitting: Family Medicine

## 2021-10-29 VITALS — BP 127/77 | HR 93 | Wt 169.7 lb

## 2021-10-29 DIAGNOSIS — Z3492 Encounter for supervision of normal pregnancy, unspecified, second trimester: Secondary | ICD-10-CM

## 2021-10-29 NOTE — Progress Notes (Signed)
   PRENATAL VISIT NOTE  Subjective:  Misty Lee is a 25 y.o. G2P0010 at [redacted]w[redacted]d being seen today for ongoing prenatal care.  She is currently monitored for the following issues for this low-risk pregnancy and has Asthma and Supervision of low-risk pregnancy, second trimester on their problem list.  Patient reports no complaints.  Contractions: Not present. Vag. Bleeding: None.  Movement: Present. Denies leaking of fluid.   The following portions of the patient's history were reviewed and updated as appropriate: allergies, current medications, past family history, past medical history, past social history, past surgical history and problem list.   Objective:   Vitals:   10/29/21 1431  BP: 127/77  Pulse: 93  Weight: 169 lb 11.2 oz (77 kg)    Fetal Status: Fetal Heart Rate (bpm): 138   Movement: Present     General:  Alert, oriented and cooperative. Patient is in no acute distress.  Skin: Skin is warm and dry. No rash noted.   Cardiovascular: Normal heart rate noted  Respiratory: Normal respiratory effort, no problems with respiration noted  Abdomen: Soft, gravid, appropriate for gestational age.  Pain/Pressure: Absent     Pelvic: Cervical exam deferred        Extremities: Normal range of motion.  Edema: None  Mental Status: Normal mood and affect. Normal behavior. Normal judgment and thought content.   Assessment and Plan:  Pregnancy: G2P0010 at [redacted]w[redacted]d 1. Supervision of low-risk pregnancy, second trimester Up to date No concerns Might move to [place] in Feb  Preterm labor symptoms and general obstetric precautions including but not limited to vaginal bleeding, contractions, leaking of fluid and fetal movement were reviewed in detail with the patient. Please refer to After Visit Summary for other counseling recommendations.   Return in about 4 weeks (around 11/26/2021) for Routine prenatal care, 28 wk labs, Dual Care-[facility] Dyad.  Future Appointments  Date Time Provider  Department Center  11/20/2021  9:30 AM [facility]  11/20/2021  9:35 AM [facility]  12/02/2021  1:15 PM [facility]  12/17/2021  1:15 PM [facility]  12/30/2021  1:15 PM [facility]  01/13/2022  1:15 PM [facility]  01/20/2022  1:15 PM [facility]    Federico Flake, MD

## 2021-11-20 ENCOUNTER — Other Ambulatory Visit: Payer: Self-pay

## 2021-11-20 ENCOUNTER — Ambulatory Visit (INDEPENDENT_AMBULATORY_CARE_PROVIDER_SITE_OTHER): Payer: BC Managed Care – PPO | Admitting: Family Medicine

## 2021-11-20 ENCOUNTER — Other Ambulatory Visit: Payer: BC Managed Care – PPO

## 2021-11-20 VITALS — BP 128/88 | HR 102 | Wt 180.0 lb

## 2021-11-20 DIAGNOSIS — Z3492 Encounter for supervision of normal pregnancy, unspecified, second trimester: Secondary | ICD-10-CM

## 2021-11-20 DIAGNOSIS — O99013 Anemia complicating pregnancy, third trimester: Secondary | ICD-10-CM

## 2021-11-20 NOTE — Progress Notes (Signed)
° °  Subjective:  Misty Lee is a 25 y.o. G2P0010 at [redacted]w[redacted]d being seen today for ongoing prenatal care.  She is currently monitored for the following issues for this low-risk pregnancy and has Asthma and Supervision of low-risk pregnancy, second trimester on their problem list.  Patient reports no complaints.  Contractions: Not present. Vag. Bleeding: None.  Movement: Present. Denies leaking of fluid.   The following portions of the patient's history were reviewed and updated as appropriate: allergies, current medications, past family history, past medical history, past social history, past surgical history and problem list. Problem list updated.  Objective:   Vitals:   11/20/21 0954  BP: 128/88  Pulse: (!) 102  Weight: 180 lb (81.6 kg)    Fetal Status: Fetal Heart Rate (bpm): 155   Movement: Present     General:  Alert, oriented and cooperative. Patient is in no acute distress.  Skin: Skin is warm and dry. No rash noted.   Cardiovascular: Normal heart rate noted  Respiratory: Normal respiratory effort, no problems with respiration noted  Abdomen: Soft, gravid, appropriate for gestational age. Pain/Pressure: Absent     Pelvic: Vag. Bleeding: None     Cervical exam deferred        Extremities: Normal range of motion.  Edema: None  Mental Status: Normal mood and affect. Normal behavior. Normal judgment and thought content.   Urinalysis:      Assessment and Plan:  Pregnancy: G2P0010 at [redacted]w[redacted]d  1. Supervision of low-risk pregnancy, second trimester BP and FHR normal 28wk labs today Would like to defer TDaP to next visit Unsure, but thinking she will probably end up staying in [place] to deliver and then move to [place]  Preterm labor symptoms and general obstetric precautions including but not limited to vaginal bleeding, contractions, leaking of fluid and fetal movement were reviewed in detail with the patient. Please refer to After Visit Summary for other counseling  recommendations.  Return in 2 weeks (on 12/04/2021) for Dyad patient, ob visit.   Venora Maples, MD

## 2021-11-20 NOTE — Patient Instructions (Addendum)
Water birth class sign up: http://www.pope.info/   Contraception Choices Contraception, also called birth control, refers to methods or devices that prevent pregnancy. Hormonal methods Contraceptive implant A contraceptive implant is a thin, plastic tube that contains a hormone that prevents pregnancy. It is different from an intrauterine device (IUD). It is inserted into the upper part of the arm by a health care provider. Implants can be effective for up to 3 years. Progestin-only injections Progestin-only injections are injections of progestin, a synthetic form of the hormone progesterone. They are given every 3 months by a health care provider. Birth control pills Birth control pills are pills that contain hormones that prevent pregnancy. They must be taken once a day, preferably at the same time each day. A prescription is needed to use this method of contraception. Birth control patch The birth control patch contains hormones that prevent pregnancy. It is placed on the skin and must be changed once a week for three weeks and removed on the fourth week. A prescription is needed to use this method of contraception. Vaginal ring A vaginal ring contains hormones that prevent pregnancy. It is placed in the vagina for three weeks and removed on the fourth week. After that, the process is repeated with a new ring. A prescription is needed to use this method of contraception. Emergency contraceptive Emergency contraceptives prevent pregnancy after unprotected sex. They come in pill form and can be taken up to 5 days after sex. They work best the sooner they are taken after having sex. Most emergency contraceptives are available without a prescription. This method should not be used as your only form of birth control. Barrier methods Female condom A female condom is a thin sheath that is worn over the penis during sex.  Condoms keep sperm from going inside a woman's body. They can be used with a sperm-killing substance (spermicide) to increase their effectiveness. They should be thrown away after one use. Female condom A female condom is a soft, loose-fitting sheath that is put into the vagina before sex. The condom keeps sperm from going inside a woman's body. They should be thrown away after one use. Diaphragm A diaphragm is a soft, dome-shaped barrier. It is inserted into the vagina before sex, along with a spermicide. The diaphragm blocks sperm from entering the uterus, and the spermicide kills sperm. A diaphragm should be left in the vagina for 6-8 hours after sex and removed within 24 hours. A diaphragm is prescribed and fitted by a health care provider. A diaphragm should be replaced every 1-2 years, after giving birth, after gaining more than 15 lb (6.8 kg), and after pelvic surgery. Cervical cap A cervical cap is a round, soft latex or plastic cup that fits over the cervix. It is inserted into the vagina before sex, along with spermicide. It blocks sperm from entering the uterus. The cap should be left in place for 6-8 hours after sex and removed within 48 hours. A cervical cap must be prescribed and fitted by a health care provider. It should be replaced every 2 years. Sponge A sponge is a soft, circular piece of polyurethane foam with spermicide in it. The sponge helps block sperm from entering the uterus, and the spermicide kills sperm. To use it, you make it wet and then insert it into the vagina. It should be inserted before sex, left in for at least 6 hours after sex, and removed and thrown away within 30 hours. Spermicides Spermicides are chemicals that kill or  block sperm from entering the cervix and uterus. They can come as a cream, jelly, suppository, foam, or tablet. A spermicide should be inserted into the vagina with an applicator at least 10-15 minutes before sex to allow time for it to work. The  process must be repeated every time you have sex. Spermicides do not require a prescription. Intrauterine contraception Intrauterine device (IUD) An IUD is a T-shaped device that is put in a woman's uterus. There are two types: Hormone IUD.This type contains progestin, a synthetic form of the hormone progesterone. This type can stay in place for 3-5 years. Copper IUD.This type is wrapped in copper wire. It can stay in place for 10 years. Permanent methods of contraception Female tubal ligation In this method, a woman's fallopian tubes are sealed, tied, or blocked during surgery to prevent eggs from traveling to the uterus. Hysteroscopic sterilization In this method, a small, flexible insert is placed into each fallopian tube. The inserts cause scar tissue to form in the fallopian tubes and block them, so sperm cannot reach an egg. The procedure takes about 3 months to be effective. Another form of birth control must be used during those 3 months. Female sterilization This is a procedure to tie off the tubes that carry sperm (vasectomy). After the procedure, the man can still ejaculate fluid (semen). Another form of birth control must be used for 3 months after the procedure. Natural planning methods Natural family planning In this method, a couple does not have sex on days when the woman could become pregnant. Calendar method In this method, the woman keeps track of the length of each menstrual cycle, identifies the days when pregnancy can happen, and does not have sex on those days. Ovulation method In this method, a couple avoids sex during ovulation. Symptothermal method This method involves not having sex during ovulation. The woman typically checks for ovulation by watching changes in her temperature and in the consistency of cervical mucus. Post-ovulation method In this method, a couple waits to have sex until after ovulation. Where to find more information Centers for Disease Control  and Prevention: [employer]Ex[employer].com.br Summary Contraception, also called birth control, refers to methods or devices that prevent pregnancy. Hormonal methods of contraception include implants, injections, pills, patches, vaginal rings, and emergency contraceptives. Barrier methods of contraception can include female condoms, female condoms, diaphragms, cervical caps, sponges, and spermicides. There are two types of IUDs (intrauterine devices). An IUD can be put in a woman's uterus to prevent pregnancy for 3-5 years. Permanent sterilization can be done through a procedure for males and females. Natural family planning methods involve nothaving sex on days when the woman could become pregnant. This information is not intended to replace advice given to you by your health care provider. Make sure you discuss any questions you have with your health care provider. Document Revised: 04/14/2020 Document Reviewed: 04/14/2020 Elsevier Patient Education  2022 [employer].

## 2021-11-21 LAB — CBC
Hematocrit: 29.1 % — ABNORMAL LOW (ref 34.0–46.6)
Hemoglobin: 9.7 g/dL — ABNORMAL LOW (ref 11.1–15.9)
MCH: 29.3 pg (ref 26.6–33.0)
MCHC: 33.3 g/dL (ref 31.5–35.7)
MCV: 88 fL (ref 79–97)
Platelets: 248 10*3/uL (ref 150–450)
RBC: 3.31 x10E6/uL — ABNORMAL LOW (ref 3.77–5.28)
RDW: 11.8 % (ref 11.7–15.4)
WBC: 7.4 10*3/uL (ref 3.4–10.8)

## 2021-11-21 LAB — RPR: RPR Ser Ql: NONREACTIVE

## 2021-11-21 LAB — GLUCOSE TOLERANCE, 2 HOURS W/ 1HR
Glucose, 1 hour: 112 mg/dL (ref 70–179)
Glucose, 2 hour: 96 mg/dL (ref 70–152)
Glucose, Fasting: 83 mg/dL (ref 70–91)

## 2021-11-21 LAB — HIV ANTIBODY (ROUTINE TESTING W REFLEX): HIV Screen 4th Generation wRfx: NONREACTIVE

## 2021-11-23 DIAGNOSIS — O99013 Anemia complicating pregnancy, third trimester: Secondary | ICD-10-CM | POA: Insufficient documentation

## 2021-11-23 MED ORDER — FERROUS SULFATE 325 (65 FE) MG PO TBEC: 325.0000 mg | DELAYED_RELEASE_TABLET | ORAL | 2 refills | Status: DC

## 2021-11-23 NOTE — Addendum Note (Signed)
Addended by: Merian Capron on: 11/23/2021 01:32 PM   Modules accepted: Orders

## 2021-12-02 ENCOUNTER — Ambulatory Visit (INDEPENDENT_AMBULATORY_CARE_PROVIDER_SITE_OTHER): Payer: BLUE CROSS/BLUE SHIELD | Admitting: Family Medicine

## 2021-12-02 ENCOUNTER — Other Ambulatory Visit: Payer: Self-pay

## 2021-12-02 VITALS — BP 117/79 | HR 96 | Wt 183.0 lb

## 2021-12-02 DIAGNOSIS — Z23 Encounter for immunization: Secondary | ICD-10-CM | POA: Diagnosis not present

## 2021-12-02 DIAGNOSIS — Z3492 Encounter for supervision of normal pregnancy, unspecified, second trimester: Secondary | ICD-10-CM

## 2021-12-02 DIAGNOSIS — O99013 Anemia complicating pregnancy, third trimester: Secondary | ICD-10-CM

## 2021-12-02 MED ORDER — FERROUS SULFATE 325 (65 FE) MG PO TBEC: 325.0000 mg | DELAYED_RELEASE_TABLET | ORAL | 2 refills | Status: AC

## 2021-12-02 NOTE — Progress Notes (Signed)
° °  PRENATAL VISIT NOTE  Subjective:  Misty Lee is a 26 y.o. G2P0010 at [redacted]w[redacted]d being seen today for ongoing prenatal care.  She is currently monitored for the following issues for this low-risk pregnancy and has Asthma; Supervision of low-risk pregnancy, second trimester; and Anemia during pregnancy in third trimester on their problem list.  Patient reports no complaints.  Contractions: Not present. Vag. Bleeding: None.  Movement: Present. Denies leaking of fluid.   The following portions of the patient's history were reviewed and updated as appropriate: allergies, current medications, past family history, past medical history, past social history, past surgical history and problem list.   Objective:   Vitals:   12/02/21 1346  BP: 117/79  Pulse: 96  Weight: 183 lb (83 kg)    Fetal Status: Fetal Heart Rate (bpm): 154   Movement: Present     General:  Alert, oriented and cooperative. Patient is in no acute distress.  Skin: Skin is warm and dry. No rash noted.   Cardiovascular: Normal heart rate noted  Respiratory: Normal respiratory effort, no problems with respiration noted  Abdomen: Soft, gravid, appropriate for gestational age.  Pain/Pressure: Absent     Pelvic: Cervical exam deferred        Extremities: Normal range of motion.  Edema: None  Mental Status: Normal mood and affect. Normal behavior. Normal judgment and thought content.   Assessment and Plan:  Pregnancy: G2P0010 at [redacted]w[redacted]d 1. Supervision of low-risk pregnancy, second trimester  Up to date - Tdap vaccine greater than or equal to 7yo IM  Preterm labor symptoms and general obstetric precautions including but not limited to vaginal bleeding, contractions, leaking of fluid and fetal movement were reviewed in detail with the patient. Please refer to After Visit Summary for other counseling recommendations.   No follow-ups on file.  Future Appointments  Date Time Provider Department Center  12/17/2021  1:15 PM  [facility]  12/30/2021  1:15 PM [facility]  01/13/2022  1:15 PM [facility]  01/20/2022  1:15 PM [facility]    Federico Flake, MD

## 2021-12-09 ENCOUNTER — Encounter: Payer: Self-pay | Admitting: Family Medicine

## 2021-12-16 ENCOUNTER — Ambulatory Visit (INDEPENDENT_AMBULATORY_CARE_PROVIDER_SITE_OTHER): Payer: Medicaid Other | Admitting: Family Medicine

## 2021-12-16 ENCOUNTER — Encounter: Payer: Self-pay | Admitting: Family Medicine

## 2021-12-16 ENCOUNTER — Other Ambulatory Visit: Payer: Self-pay

## 2021-12-16 VITALS — BP 115/75 | HR 105 | Wt 185.4 lb

## 2021-12-16 DIAGNOSIS — O99013 Anemia complicating pregnancy, third trimester: Secondary | ICD-10-CM

## 2021-12-16 DIAGNOSIS — Z3492 Encounter for supervision of normal pregnancy, unspecified, second trimester: Secondary | ICD-10-CM

## 2021-12-16 LAB — CBC
Hematocrit: 29.8 % — ABNORMAL LOW (ref 34.0–46.6)
Hemoglobin: 10 g/dL — ABNORMAL LOW (ref 11.1–15.9)
MCH: 28.7 pg (ref 26.6–33.0)
MCHC: 33.6 g/dL (ref 31.5–35.7)
MCV: 85 fL (ref 79–97)
Platelets: 318 10*3/uL (ref 150–450)
RBC: 3.49 x10E6/uL — ABNORMAL LOW (ref 3.77–5.28)
RDW: 12.1 % (ref 11.7–15.4)
WBC: 10.4 10*3/uL (ref 3.4–10.8)

## 2021-12-16 NOTE — Progress Notes (Signed)
° ° °  PRENATAL VISIT NOTE  Subjective:  Misty Lee is a 26 y.o. G2P0010 at [redacted]w[redacted]d being seen today for ongoing prenatal care.  She is currently monitored for the following issues for this low-risk pregnancy and has Asthma; Supervision of low-risk pregnancy, second trimester; and Anemia during pregnancy in third trimester on their problem list.  Patient reports no complaints.  Contractions: Not present. Vag. Bleeding: None.  Movement: Present. Denies leaking of fluid.   The following portions of the patient's history were reviewed and updated as appropriate: allergies, current medications, past family history, past medical history, past social history, past surgical history and problem list.   Objective:   Vitals:   12/16/21 1501  BP: 115/75  Pulse: (!) 105  Weight: 185 lb 6.4 oz (84.1 kg)    Fetal Status: Fetal Heart Rate (bpm): 137 Fundal Height: 32 cm Movement: Present     General:  Alert, oriented and cooperative. Patient is in no acute distress.  Skin: Skin is warm and dry. No rash noted.   Cardiovascular: Normal heart rate noted  Respiratory: Normal respiratory effort, no problems with respiration noted  Abdomen: Soft, gravid, appropriate for gestational age.  Pain/Pressure: Absent     Pelvic: Cervical exam deferred        Extremities: Normal range of motion.  Edema: None  Mental Status: Normal mood and affect. Normal behavior. Normal judgment and thought content.   Assessment and Plan:  Pregnancy: G2P0010 at [redacted]w[redacted]d 1. Supervision of low-risk pregnancy, second trimester Up to date Has baby shower this weekend  2. Anemia during pregnancy in third trimester Last hgb 9.7, reports taking Fe CBC  Preterm labor symptoms and general obstetric precautions including but not limited to vaginal bleeding, contractions, leaking of fluid and fetal movement were reviewed in detail with the patient. Please refer to After Visit Summary for other counseling recommendations.   Return in  about 2 weeks (around 12/30/2021) for [facility], scheduled visit.  Future Appointments  Date Time Provider Department Center  12/30/2021  1:15 PM [facility]  01/13/2022  1:15 PM [facility]  01/20/2022  1:15 PM [facility]  01/25/2022  8:15 AM [facility]    Federico Flake, MD

## 2021-12-17 ENCOUNTER — Encounter: Payer: Self-pay | Admitting: Family Medicine

## 2021-12-17 DIAGNOSIS — B009 Herpesviral infection, unspecified: Secondary | ICD-10-CM | POA: Insufficient documentation

## 2022-01-05 ENCOUNTER — Encounter: Payer: Self-pay | Admitting: *Deleted

## 2022-01-07 ENCOUNTER — Ambulatory Visit (INDEPENDENT_AMBULATORY_CARE_PROVIDER_SITE_OTHER): Payer: Medicaid Other | Admitting: Family Medicine

## 2022-01-07 ENCOUNTER — Other Ambulatory Visit: Payer: Self-pay

## 2022-01-07 VITALS — BP 117/76 | HR 103 | Wt 183.4 lb

## 2022-01-07 DIAGNOSIS — B009 Herpesviral infection, unspecified: Secondary | ICD-10-CM

## 2022-01-07 DIAGNOSIS — Z3492 Encounter for supervision of normal pregnancy, unspecified, second trimester: Secondary | ICD-10-CM

## 2022-01-07 MED ORDER — VALACYCLOVIR HCL 500 MG PO TABS: 500.0000 mg | ORAL_TABLET | Freq: Two times a day (BID) | ORAL | 6 refills | Status: DC

## 2022-01-07 MED ORDER — VALACYCLOVIR HCL 500 MG PO TABS: 500.0000 mg | ORAL_TABLET | Freq: Two times a day (BID) | ORAL | 6 refills | Status: AC

## 2022-01-07 NOTE — Progress Notes (Signed)
° ° °  PRENATAL VISIT NOTE  Subjective:  Misty Lee is a 26 y.o. G2P0010 at [redacted]w[redacted]d being seen today for ongoing prenatal care.  She is currently monitored for the following issues for this low-risk pregnancy and has Asthma; Supervision of low-risk pregnancy, second trimester; Anemia during pregnancy in third trimester; and HSV infection on their problem list.  Patient reports no complaints.  Contractions: Irritability. Vag. Bleeding: None.  Movement: Present. Denies leaking of fluid.   The following portions of the patient's history were reviewed and updated as appropriate: allergies, current medications, past family history, past medical history, past social history, past surgical history and problem list.   Objective:   Vitals:   01/07/22 0848  BP: 117/76  Pulse: (!) 103  Weight: 183 lb 6.4 oz (83.2 kg)    Fetal Status: Fetal Heart Rate (bpm): 144 Fundal Height: 34 cm Movement: Present  Presentation: Vertex  General:  Alert, oriented and cooperative. Patient is in no acute distress.  Skin: Skin is warm and dry. No rash noted.   Cardiovascular: Normal heart rate noted  Respiratory: Normal respiratory effort, no problems with respiration noted  Abdomen: Soft, gravid, appropriate for gestational age.  Pain/Pressure: Present     Pelvic: Cervical exam deferred        Extremities: Normal range of motion.  Edema: None  Mental Status: Normal mood and affect. Normal behavior. Normal judgment and thought content.   Assessment and Plan:  Pregnancy: G2P0010 at [redacted]w[redacted]d  1. Supervision of low-risk pregnancy, second trimester Up to date 36 wk swabs next visit Desires EFW- suspect about 6lb currently  2. HSV infection Start PPX today  Valtrex sent to pharmacy locally   Preterm labor symptoms and general obstetric precautions including but not limited to vaginal bleeding, contractions, leaking of fluid and fetal movement were reviewed in detail with the patient. Please refer to After Visit  Summary for other counseling recommendations.   Return in about 1 week (around 01/14/2022) for Routine prenatal care, 36wks.  Future Appointments  Date Time Provider Department Center  01/13/2022  1:15 PM [facility]  01/20/2022  1:15 PM [facility]  01/25/2022  8:15 AM [facility]    Federico Flake, MD

## 2022-01-09 IMAGING — US US OB COMP LESS 14 WK
1 series · 15 of 28 positions shown · non-contrast
Comparison: None.

CLINICAL DATA: Brown spotting

EXAM:
OBSTETRIC <14 WK US AND TRANSVAGINAL OB US
TECHNIQUE: Both transabdominal and transvaginal ultrasound examinations were
performed for complete evaluation of the gestation as well as the
maternal uterus, adnexal regions, and pelvic cul-de-sac.
Transvaginal technique was performed to assess early pregnancy.

[Series 1: us ob comp less 14 wk · 41 acquisitions, 15 frames shown]
[im 1/41]
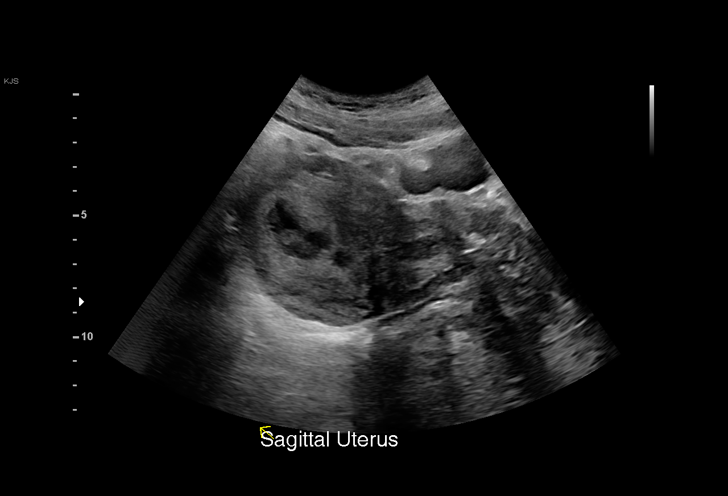
[im 3/41]
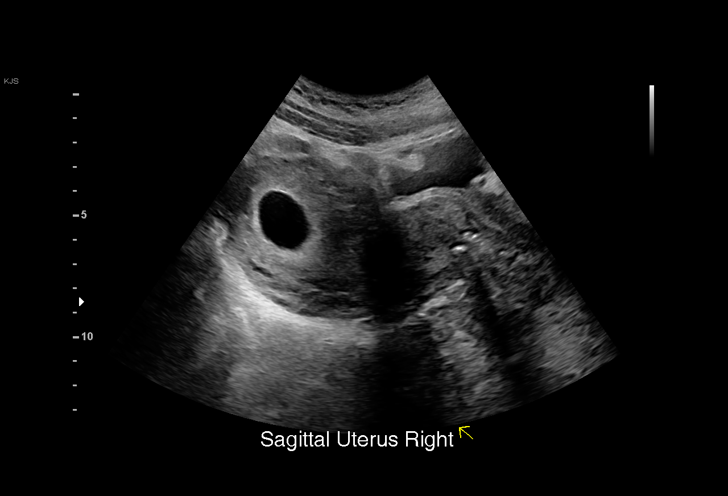
[im 6/41]
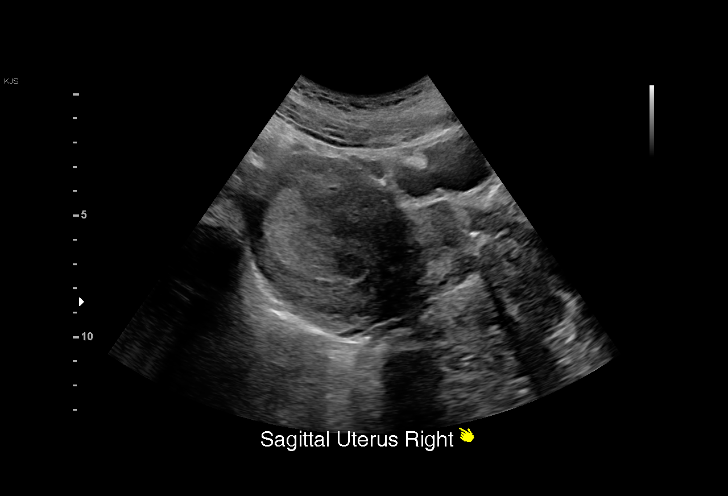
[im 9/41]
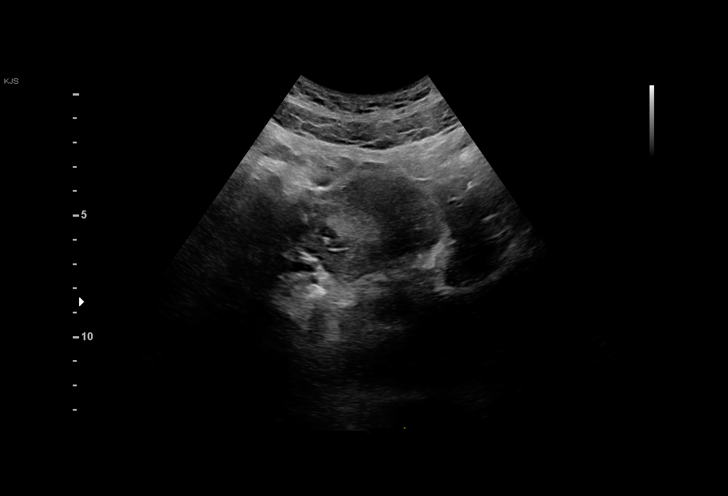
[im 12/41]
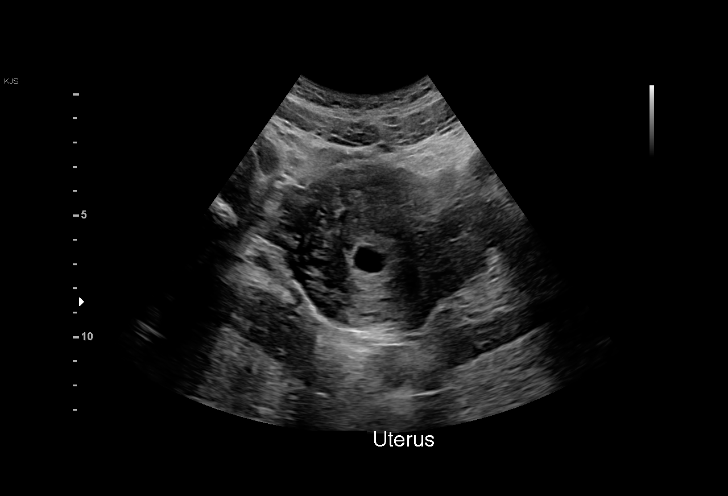
[im 15/41]
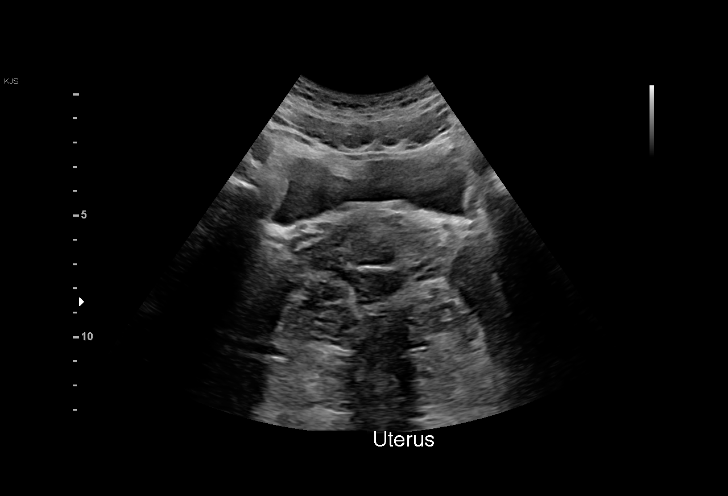
[im 18/41]
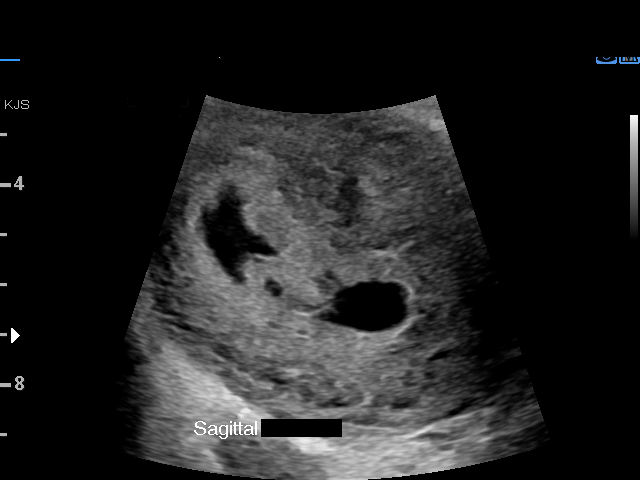
[im 21/41]
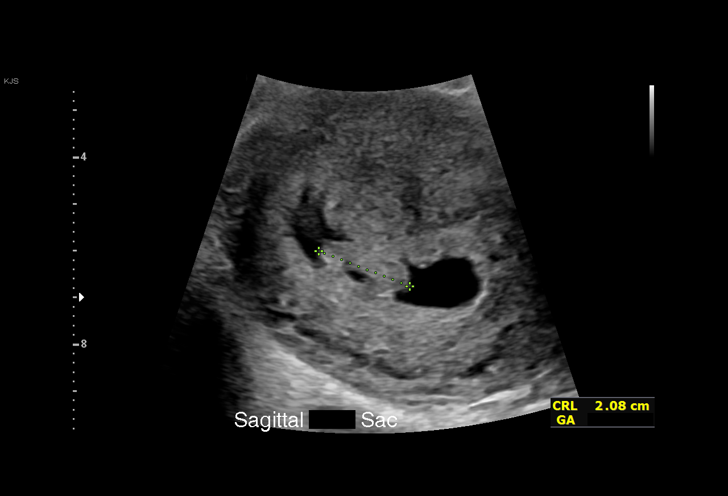
[im 23/41]
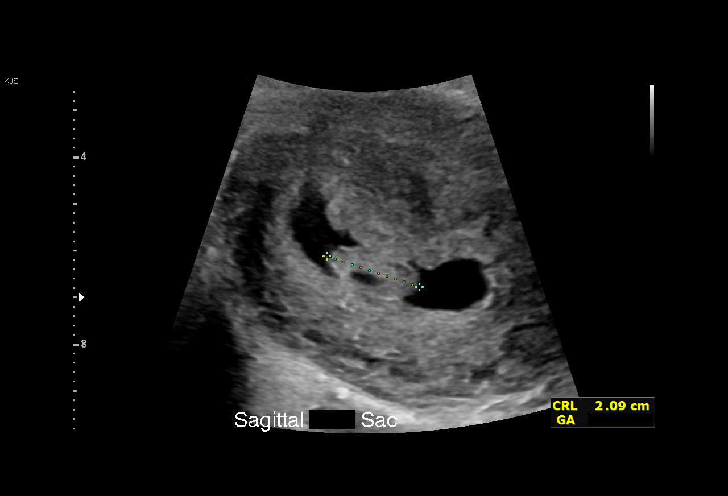
[im 26/41]
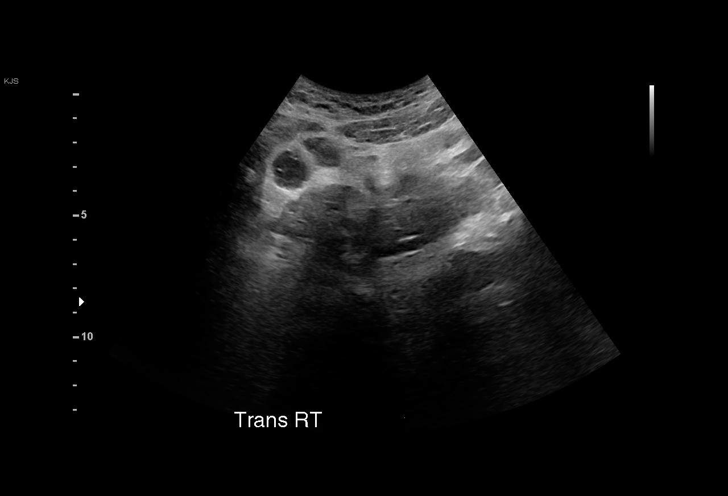
[im 29/41]
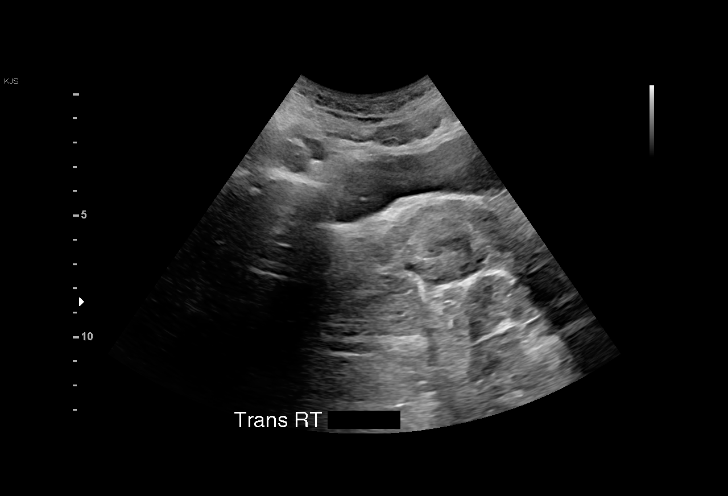
[im 32/41]
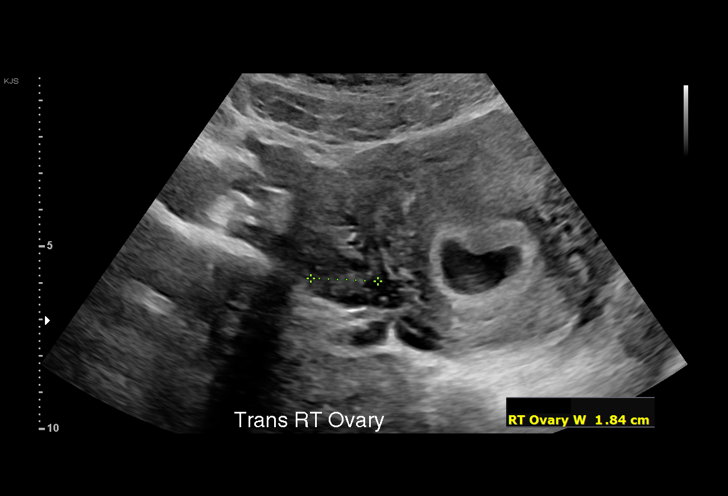
[im 35/41]
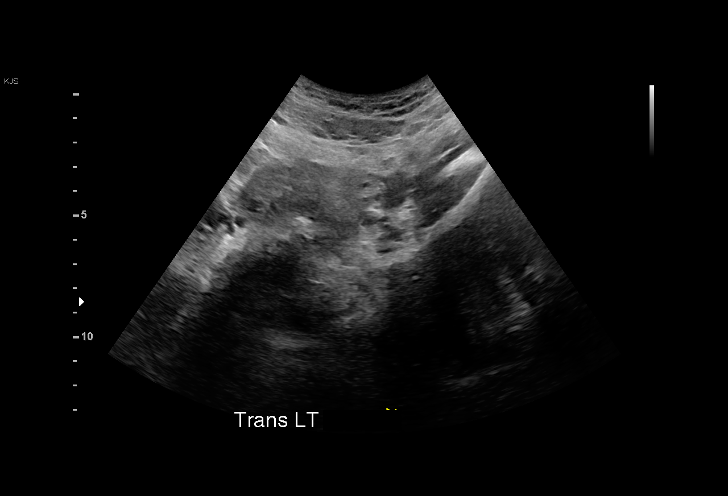
[im 38/41]
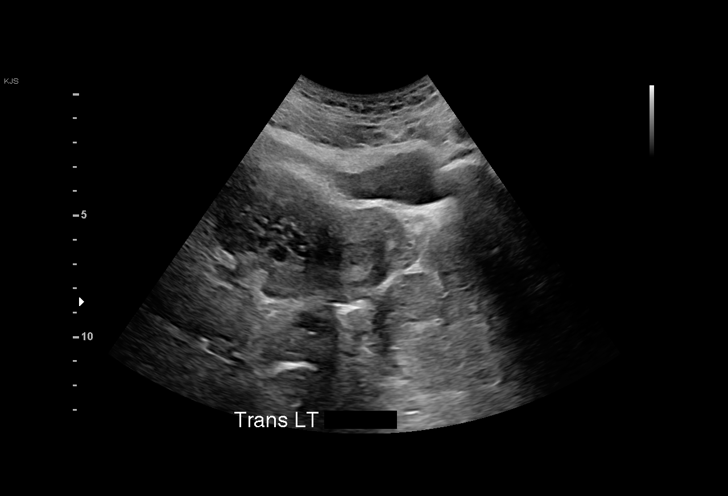
[im 41/41]
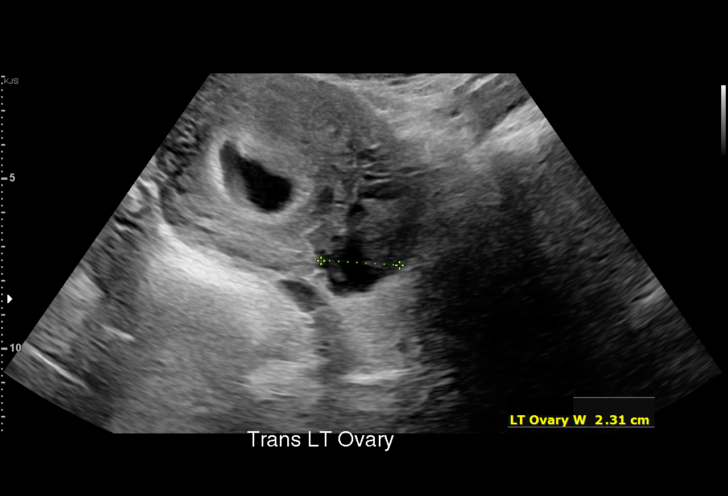

[15 of 28 positions shown; findings below may reference images not displayed]

FINDINGS: Intrauterine gestational sac: Single

Yolk sac:  Visualized.

Embryo:  Visualized.

Cardiac Activity: Not Visualized.

Heart Rate: No detectable cardiac activity.

CRL:  21.0 mm   8 w   4 d                  US EDC: 01/30/2022

Subchorionic hemorrhage:  None visualized.

Maternal uterus/adnexae: Normal bilateral ovaries.
IMPRESSION: Single intrauterine gestational sac. No detectable cardiac activity
with embryo crown-rump length of 21.0 mm. These findings meet
definitive criteria for failed pregnancy. This follows SRU consensus
guidelines: Diagnostic Criteria for Nonviable Pregnancy Early in the
First Trimester. N Engl J Med 1164;[DATE].

## 2022-01-13 ENCOUNTER — Other Ambulatory Visit (HOSPITAL_COMMUNITY)
Admission: RE | Admit: 2022-01-13 | Discharge: 2022-01-13 | Disposition: A | Discharge status: Home / Self Care | Payer: BLUE CROSS/BLUE SHIELD | Source: Ambulatory Visit | Attending: Family Medicine | Admitting: Family Medicine

## 2022-01-13 ENCOUNTER — Other Ambulatory Visit: Payer: Self-pay

## 2022-01-13 ENCOUNTER — Ambulatory Visit (INDEPENDENT_AMBULATORY_CARE_PROVIDER_SITE_OTHER): Payer: Medicaid Other | Admitting: Family Medicine

## 2022-01-13 VITALS — BP 108/71 | HR 115 | Wt 193.0 lb

## 2022-01-13 DIAGNOSIS — Z3492 Encounter for supervision of normal pregnancy, unspecified, second trimester: Secondary | ICD-10-CM | POA: Insufficient documentation

## 2022-01-13 DIAGNOSIS — B009 Herpesviral infection, unspecified: Secondary | ICD-10-CM

## 2022-01-13 MED ORDER — POLYETHYLENE GLYCOL 3350 17 GM/SCOOP PO POWD: 17.0000 g | Freq: Every day | ORAL | 1 refills | Status: AC | PRN

## 2022-01-13 NOTE — Patient Instructions (Signed)

## 2022-01-13 NOTE — Progress Notes (Signed)
° ° °  Subjective:  Misty Lee is a 26 y.o. G2P0010 at [redacted]w[redacted]d being seen today for ongoing prenatal care.  She is currently monitored for the following issues for this low-risk pregnancy and has Asthma; Supervision of low-risk pregnancy, second trimester; Anemia during pregnancy in third trimester; and HSV infection on their problem list.  Patient reports no complaints.  Contractions: Irritability. Vag. Bleeding: None.  Movement: Present. Denies leaking of fluid.   The following portions of the patient's history were reviewed and updated as appropriate: allergies, current medications, past family history, past medical history, past social history, past surgical history and problem list. Problem list updated.  Objective:   Vitals:   01/13/22 1321  BP: 108/71  Pulse: (!) 115  Weight: 193 lb (87.5 kg)    Fetal Status: Fetal Heart Rate (bpm): 154   Movement: Present  Presentation: Vertex  General:  Alert, oriented and cooperative. Patient is in no acute distress.  Skin: Skin is warm and dry. No rash noted.   Cardiovascular: Normal heart rate noted  Respiratory: Normal respiratory effort, no problems with respiration noted  Abdomen: Soft, gravid, appropriate for gestational age. Pain/Pressure: Present     Pelvic: Vag. Bleeding: None     Cervical exam performed Dilation: Closed Effacement (%): Thick Station: -2 Significant stool burden palpated on exam  Extremities: Normal range of motion.  Edema: None  Mental Status: Normal mood and affect. Normal behavior. Normal judgment and thought content.   Urinalysis:      Assessment and Plan:  Pregnancy: G2P0010 at [redacted]w[redacted]d  1. Supervision of low-risk pregnancy, second trimester BP and FHR normal Swabs today Patient very interested in having induction date as she moved to [place] 3 weeks ago and is driving all the way here for appts We discussed this is very reasonable but it would be ideal if we did an induction when her cervix is favorable,  likely closer to her due date Will defer until next visit Also recommended she stay in [place] after she is term if she is able Would be a good candidate for outpatient foley bulb  2. HSV infection On prophylaxis  Preterm labor symptoms and general obstetric precautions including but not limited to vaginal bleeding, contractions, leaking of fluid and fetal movement were reviewed in detail with the patient. Please refer to After Visit Summary for other counseling recommendations.  Return in 1 week (on 01/20/2022) for Dyad patient, ob visit.   Venora Maples, MD

## 2022-01-14 LAB — GC/CHLAMYDIA PROBE AMP (~~LOC~~) NOT AT ARMC
Chlamydia: NEGATIVE
Comment: NEGATIVE
Comment: NORMAL
Neisseria Gonorrhea: NEGATIVE

## 2022-01-16 LAB — CULTURE, BETA STREP (GROUP B ONLY): Strep Gp B Culture: POSITIVE — AB

## 2022-01-17 ENCOUNTER — Encounter: Payer: Self-pay | Admitting: Family Medicine

## 2022-01-17 DIAGNOSIS — O99820 Streptococcus B carrier state complicating pregnancy: Secondary | ICD-10-CM | POA: Insufficient documentation

## 2022-01-20 ENCOUNTER — Other Ambulatory Visit: Payer: Self-pay

## 2022-01-20 ENCOUNTER — Ambulatory Visit (INDEPENDENT_AMBULATORY_CARE_PROVIDER_SITE_OTHER): Payer: Medicaid Other | Admitting: Family Medicine

## 2022-01-20 VITALS — BP 126/84 | HR 123 | Wt 192.8 lb

## 2022-01-20 DIAGNOSIS — O99013 Anemia complicating pregnancy, third trimester: Secondary | ICD-10-CM

## 2022-01-20 DIAGNOSIS — Z3492 Encounter for supervision of normal pregnancy, unspecified, second trimester: Secondary | ICD-10-CM

## 2022-01-20 DIAGNOSIS — Z3A36 36 weeks gestation of pregnancy: Secondary | ICD-10-CM

## 2022-01-20 DIAGNOSIS — B009 Herpesviral infection, unspecified: Secondary | ICD-10-CM

## 2022-01-20 DIAGNOSIS — O99820 Streptococcus B carrier state complicating pregnancy: Secondary | ICD-10-CM

## 2022-01-20 NOTE — Progress Notes (Signed)
? ?  Subjective:  ?Misty Lee is a 26 y.o. G2P0010 at [redacted]w[redacted]d being seen today for ongoing prenatal care.  She is currently monitored for the following issues for this low-risk pregnancy and has Asthma; Supervision of low-risk pregnancy, second trimester; Anemia during pregnancy in third trimester; HSV infection; and GBS (group B Streptococcus carrier), +RV culture, currently pregnant on their problem list. ? ?Patient reports no complaints.  Contractions: Not present. Vag. Bleeding: None.  Movement: Present. Denies leaking of fluid.  ? ?The following portions of the patient's history were reviewed and updated as appropriate: allergies, current medications, past family history, past medical history, past social history, past surgical history and problem list. Problem list updated. ? ?Objective:  ? ?Vitals:  ? 01/20/22 1330  ?BP: 126/84  ?Pulse: (!) 123  ?Weight: 192 lb 12.8 oz (87.5 kg)  ? ? ?Fetal Status: Fetal Heart Rate (bpm): 145   Movement: Present    ? ?General:  Alert, oriented and cooperative. Patient is in no acute distress.  ?Skin: Skin is warm and dry. No rash noted.   ?Cardiovascular: Normal heart rate noted  ?Respiratory: Normal respiratory effort, no problems with respiration noted  ?Abdomen: Soft, gravid, appropriate for gestational age. Pain/Pressure: Present     ?Pelvic: Vag. Bleeding: None     ?Cervical exam deferred        ?Extremities: Normal range of motion.  Edema: None  ?Mental Status: Normal mood and affect. Normal behavior. Normal judgment and thought content.  ? ?Urinalysis:     ? ?Assessment and Plan:  ?Pregnancy: G2P0010 at [redacted]w[redacted]d ? ?1. Supervision of low-risk pregnancy, second trimester ?BP and FHR normal ? ?2. HSV infection ?On prophylaxis ? ?3. GBS (group B Streptococcus carrier), +RV culture, currently pregnant ?PPX in labor ? ?4. Anemia during pregnancy in third trimester ?Lab Results  ?Component Value Date  ? HGB 10.0 (L) 12/16/2021  ? ? ?Preterm labor symptoms and general obstetric  precautions including but not limited to vaginal bleeding, contractions, leaking of fluid and fetal movement were reviewed in detail with the patient. ?Please refer to After Visit Summary for other counseling recommendations.  ?Return in about 1 week (around 01/27/2022) for Dyad patient, ob visit. ? ? ?Venora Maples, MD ? ?

## 2022-01-28 ENCOUNTER — Encounter (HOSPITAL_COMMUNITY): Payer: Self-pay | Admitting: *Deleted

## 2022-01-28 ENCOUNTER — Telehealth (HOSPITAL_COMMUNITY): Payer: Self-pay | Admitting: *Deleted

## 2022-01-28 ENCOUNTER — Other Ambulatory Visit: Payer: Self-pay

## 2022-01-28 ENCOUNTER — Ambulatory Visit (INDEPENDENT_AMBULATORY_CARE_PROVIDER_SITE_OTHER): Payer: BLUE CROSS/BLUE SHIELD | Admitting: Family Medicine

## 2022-01-28 VITALS — BP 134/86 | HR 102 | Wt 193.3 lb

## 2022-01-28 DIAGNOSIS — O99820 Streptococcus B carrier state complicating pregnancy: Secondary | ICD-10-CM

## 2022-01-28 DIAGNOSIS — Z3492 Encounter for supervision of normal pregnancy, unspecified, second trimester: Secondary | ICD-10-CM

## 2022-01-28 DIAGNOSIS — B009 Herpesviral infection, unspecified: Secondary | ICD-10-CM

## 2022-01-28 NOTE — Patient Instructions (Signed)

## 2022-01-28 NOTE — Telephone Encounter (Signed)
Preadmission screen  

## 2022-01-28 NOTE — Progress Notes (Signed)
? ?  PRENATAL VISIT NOTE ? ?Subjective:  ?Misty Lee is a 26 y.o. G2P0010 at [redacted]w[redacted]d being seen today for ongoing prenatal care.  She is currently monitored for the following issues for this low-risk pregnancy and has Asthma; Supervision of low-risk pregnancy, second trimester; Anemia during pregnancy in third trimester; HSV infection; and GBS (group B Streptococcus carrier), +RV culture, currently pregnant on their problem list. ? ?Patient reports no complaints.  Contractions: Irritability. Vag. Bleeding: None.  Movement: Present. Denies leaking of fluid.  ? ?The following portions of the patient's history were reviewed and updated as appropriate: allergies, current medications, past family history, past medical history, past social history, past surgical history and problem list.  ? ?Objective:  ? ?Vitals:  ? 01/28/22 1011  ?BP: 134/86  ?Pulse: (!) 102  ?Weight: 193 lb 4.8 oz (87.7 kg)  ? ? ?Fetal Status: Fetal Heart Rate (bpm): 150 Fundal Height: 36 cm Movement: Present  Presentation: Vertex ? ?General:  Alert, oriented and cooperative. Patient is in no acute distress.  ?Skin: Skin is warm and dry. No rash noted.   ?Cardiovascular: Normal heart rate noted  ?Respiratory: Normal respiratory effort, no problems with respiration noted  ?Abdomen: Soft, gravid, appropriate for gestational age.  Pain/Pressure: Present     ?Pelvic: Cervical exam performed in the presence of a chaperone Dilation: 1 Effacement (%): 80 Station: -3, -2  ?Extremities: Normal range of motion.  Edema: None  ?Mental Status: Normal mood and affect. Normal behavior. Normal judgment and thought content.  ? ?Assessment and Plan:  ?Pregnancy: G2P0010 at [redacted]w[redacted]d ?1. Supervision of low-risk pregnancy, second trimester ?Continue routine prenatal care. ?Lives in [place] and strongly desires to give birth in [place]--will return for foley bulb insertion and IOL at 39 weeks for planning purposes. Also, desires water birth and took class. Has not seen a  midwife. Will have her see Dr. Alvester Morin next week to discuss options. Advised if Pitocin is needed cannot be in the water. ?Orders placed for IOL ? ?2. HSV infection ?ON Valtrex, no outbreaks ? ?3. GBS (group B Streptococcus carrier), +RV culture, currently pregnant ?Will need treatment in labor ? ?Preterm labor symptoms and general obstetric precautions including but not limited to vaginal bleeding, contractions, leaking of fluid and fetal movement were reviewed in detail with the patient. ?Please refer to After Visit Summary for other counseling recommendations.  ? ?Return in 1 week (on 02/04/2022). ? ?Future Appointments  ?Date Time Provider Department Center  ?02/03/2022  4:15 PM [facility]  ?02/04/2022 12:00 AM [facility] SCHED ROOM [facility] None  ? ? ?Reva Bores, MD ? ?

## 2022-01-29 ENCOUNTER — Other Ambulatory Visit: Payer: Self-pay | Admitting: Advanced Practice Midwife

## 2022-02-01 DIAGNOSIS — D252 Subserosal leiomyoma of uterus: Secondary | ICD-10-CM | POA: Diagnosis not present

## 2022-02-01 DIAGNOSIS — O99824 Streptococcus B carrier state complicating childbirth: Secondary | ICD-10-CM | POA: Diagnosis not present

## 2022-02-01 DIAGNOSIS — R109 Unspecified abdominal pain: Secondary | ICD-10-CM | POA: Diagnosis not present

## 2022-02-01 DIAGNOSIS — A6000 Herpesviral infection of urogenital system, unspecified: Secondary | ICD-10-CM | POA: Diagnosis not present

## 2022-02-01 DIAGNOSIS — Z98891 History of uterine scar from previous surgery: Secondary | ICD-10-CM | POA: Diagnosis not present

## 2022-02-01 DIAGNOSIS — J45909 Unspecified asthma, uncomplicated: Secondary | ICD-10-CM | POA: Diagnosis not present

## 2022-02-01 DIAGNOSIS — Z3A38 38 weeks gestation of pregnancy: Secondary | ICD-10-CM | POA: Diagnosis not present

## 2022-02-01 DIAGNOSIS — O9832 Other infections with a predominantly sexual mode of transmission complicating childbirth: Secondary | ICD-10-CM | POA: Diagnosis not present

## 2022-02-01 DIAGNOSIS — O4202 Full-term premature rupture of membranes, onset of labor within 24 hours of rupture: Secondary | ICD-10-CM | POA: Diagnosis not present

## 2022-02-01 DIAGNOSIS — O324XX0 Maternal care for high head at term, not applicable or unspecified: Secondary | ICD-10-CM | POA: Diagnosis not present

## 2022-02-01 DIAGNOSIS — G8918 Other acute postprocedural pain: Secondary | ICD-10-CM | POA: Diagnosis not present

## 2022-02-01 DIAGNOSIS — Z91013 Allergy to seafood: Secondary | ICD-10-CM | POA: Diagnosis not present

## 2022-02-01 DIAGNOSIS — O4292 Full-term premature rupture of membranes, unspecified as to length of time between rupture and onset of labor: Secondary | ICD-10-CM | POA: Diagnosis not present

## 2022-02-01 DIAGNOSIS — O9902 Anemia complicating childbirth: Secondary | ICD-10-CM | POA: Diagnosis not present

## 2022-02-01 DIAGNOSIS — O9952 Diseases of the respiratory system complicating childbirth: Secondary | ICD-10-CM | POA: Diagnosis not present

## 2022-02-01 DIAGNOSIS — Z79899 Other long term (current) drug therapy: Secondary | ICD-10-CM | POA: Diagnosis not present

## 2022-02-01 DIAGNOSIS — O3413 Maternal care for benign tumor of corpus uteri, third trimester: Secondary | ICD-10-CM | POA: Diagnosis not present

## 2022-02-04 ENCOUNTER — Inpatient Hospital Stay (HOSPITAL_COMMUNITY): Payer: BLUE CROSS/BLUE SHIELD

## 2022-02-04 ENCOUNTER — Inpatient Hospital Stay (HOSPITAL_COMMUNITY)
Admission: AD | Admit: 2022-02-04 | Payer: BLUE CROSS/BLUE SHIELD | Source: Home / Self Care | Admitting: Family Medicine

## 2022-02-04 DIAGNOSIS — Z3492 Encounter for supervision of normal pregnancy, unspecified, second trimester: Secondary | ICD-10-CM

## 2022-02-04 DIAGNOSIS — B009 Herpesviral infection, unspecified: Secondary | ICD-10-CM

## 2022-07-28 DIAGNOSIS — X58XXXA Exposure to other specified factors, initial encounter: Secondary | ICD-10-CM | POA: Diagnosis not present

## 2022-07-28 DIAGNOSIS — B009 Herpesviral infection, unspecified: Secondary | ICD-10-CM | POA: Diagnosis not present

## 2022-07-28 DIAGNOSIS — S61307A Unspecified open wound of left little finger with damage to nail, initial encounter: Secondary | ICD-10-CM | POA: Diagnosis not present

## 2022-07-28 DIAGNOSIS — S6992XA Unspecified injury of left wrist, hand and finger(s), initial encounter: Secondary | ICD-10-CM | POA: Diagnosis not present

## 2022-09-15 DIAGNOSIS — Z0000 Encounter for general adult medical examination without abnormal findings: Secondary | ICD-10-CM | POA: Diagnosis not present

## 2023-11-17 ENCOUNTER — Emergency Department (HOSPITAL_COMMUNITY)
Admission: EM | Admit: 2023-11-17 | Discharge: 2023-11-17 | Discharge status: Against Medical Advice | Payer: Medicaid Other | Attending: Emergency Medicine | Admitting: Emergency Medicine

## 2023-11-17 DIAGNOSIS — R1030 Lower abdominal pain, unspecified: Secondary | ICD-10-CM | POA: Insufficient documentation

## 2023-11-17 DIAGNOSIS — Z5321 Procedure and treatment not carried out due to patient leaving prior to being seen by health care provider: Secondary | ICD-10-CM | POA: Insufficient documentation

## 2023-11-17 DIAGNOSIS — R110 Nausea: Secondary | ICD-10-CM | POA: Diagnosis not present

## 2023-11-17 LAB — CBC
HCT: 37.5 % (ref 36.0–46.0)
Hemoglobin: 11.9 g/dL — ABNORMAL LOW (ref 12.0–15.0)
MCH: 29 pg (ref 26.0–34.0)
MCHC: 31.7 g/dL (ref 30.0–36.0)
MCV: 91.2 fL (ref 80.0–100.0)
Platelets: 356 10*3/uL (ref 150–400)
RBC: 4.11 MIL/uL (ref 3.87–5.11)
RDW: 12.6 % (ref 11.5–15.5)
WBC: 4.5 10*3/uL (ref 4.0–10.5)
nRBC: 0 % (ref 0.0–0.2)

## 2023-11-17 LAB — URINALYSIS, ROUTINE W REFLEX MICROSCOPIC
Bacteria, UA: NONE SEEN
Bilirubin Urine: NEGATIVE
Glucose, UA: NEGATIVE mg/dL
Ketones, ur: NEGATIVE mg/dL
Leukocytes,Ua: NEGATIVE
Nitrite: NEGATIVE
Protein, ur: NEGATIVE mg/dL
Specific Gravity, Urine: 1.006 (ref 1.005–1.030)
pH: 8 (ref 5.0–8.0)

## 2023-11-17 LAB — HCG, SERUM, QUALITATIVE: Preg, Serum: NEGATIVE

## 2023-11-17 LAB — COMPREHENSIVE METABOLIC PANEL
ALT: 11 U/L (ref 0–44)
AST: 13 U/L — ABNORMAL LOW (ref 15–41)
Albumin: 3.8 g/dL (ref 3.5–5.0)
Alkaline Phosphatase: 44 U/L (ref 38–126)
Anion gap: 6 (ref 5–15)
BUN: 10 mg/dL (ref 6–20)
CO2: 24 mmol/L (ref 22–32)
Calcium: 8.8 mg/dL — ABNORMAL LOW (ref 8.9–10.3)
Chloride: 108 mmol/L (ref 98–111)
Creatinine, Ser: 0.62 mg/dL (ref 0.44–1.00)
GFR, Estimated: >60 mL/min (ref >60)
Glucose, Bld: 86 mg/dL (ref 70–99)
Potassium: 3.5 mmol/L (ref 3.5–5.1)
Sodium: 138 mmol/L (ref 135–145)
Total Bilirubin: 0.7 mg/dL (ref <1.2)
Total Protein: 6.7 g/dL (ref 6.5–8.1)

## 2023-11-17 LAB — LIPASE, BLOOD: Lipase: 39 U/L (ref 11–51)

## 2023-11-17 NOTE — ED Notes (Signed)
Pt called several times throughout the ED for vital signs, no response.

## 2023-11-17 NOTE — ED Triage Notes (Signed)
Pt c/o absence of menstruation since 10/31 with lower abd cramping and nausea intermittently.

## 2024-08-16 DIAGNOSIS — F411 Generalized anxiety disorder: Secondary | ICD-10-CM | POA: Diagnosis not present

## 2024-08-23 DIAGNOSIS — F411 Generalized anxiety disorder: Secondary | ICD-10-CM | POA: Diagnosis not present

## 2024-08-30 DIAGNOSIS — F411 Generalized anxiety disorder: Secondary | ICD-10-CM | POA: Diagnosis not present

## 2024-09-06 DIAGNOSIS — F411 Generalized anxiety disorder: Secondary | ICD-10-CM | POA: Diagnosis not present

## 2024-09-13 DIAGNOSIS — F411 Generalized anxiety disorder: Secondary | ICD-10-CM | POA: Diagnosis not present

## 2024-09-20 DIAGNOSIS — F411 Generalized anxiety disorder: Secondary | ICD-10-CM | POA: Diagnosis not present

## 2024-09-27 DIAGNOSIS — F411 Generalized anxiety disorder: Secondary | ICD-10-CM | POA: Diagnosis not present

## 2024-10-04 DIAGNOSIS — F411 Generalized anxiety disorder: Secondary | ICD-10-CM | POA: Diagnosis not present

## 2024-10-11 DIAGNOSIS — F411 Generalized anxiety disorder: Secondary | ICD-10-CM | POA: Diagnosis not present

## 2024-10-18 DIAGNOSIS — F411 Generalized anxiety disorder: Secondary | ICD-10-CM | POA: Diagnosis not present

## 2024-10-25 DIAGNOSIS — F411 Generalized anxiety disorder: Secondary | ICD-10-CM | POA: Diagnosis not present

## 2024-11-01 DIAGNOSIS — F411 Generalized anxiety disorder: Secondary | ICD-10-CM | POA: Diagnosis not present
